# Patient Record
Sex: Female | Born: 1960 | Race: Black or African American | Hispanic: No | Marital: Single | Smoking: Never smoker
Health system: Southern US, Community
[De-identification: ages and names within clinical notes are randomized; demographics above are authoritative.]

## PROBLEM LIST (undated history)

## (undated) ENCOUNTER — Emergency Department (HOSPITAL_BASED_OUTPATIENT_CLINIC_OR_DEPARTMENT_OTHER): Admission: EM | Source: Home / Self Care

## (undated) DIAGNOSIS — I639 Cerebral infarction, unspecified: Secondary | ICD-10-CM

## (undated) DIAGNOSIS — R569 Unspecified convulsions: Secondary | ICD-10-CM

## (undated) DIAGNOSIS — I10 Essential (primary) hypertension: Secondary | ICD-10-CM

## (undated) DIAGNOSIS — Z87442 Personal history of urinary calculi: Secondary | ICD-10-CM

## (undated) HISTORY — DX: Unspecified convulsions: R56.9

## (undated) HISTORY — PX: APPENDECTOMY: SHX54

## (undated) HISTORY — DX: Personal history of urinary calculi: Z87.442

## (undated) HISTORY — DX: Cerebral infarction, unspecified: I63.9

---

## 2012-06-19 ENCOUNTER — Ambulatory Visit (INDEPENDENT_AMBULATORY_CARE_PROVIDER_SITE_OTHER): Payer: Medicare Other | Admitting: Family

## 2012-06-19 ENCOUNTER — Encounter: Payer: Self-pay | Admitting: Family

## 2012-06-19 VITALS — BP 140/82 | HR 100 | Temp 98.3°F | Resp 16 | Ht 61.0 in | Wt 174.0 lb

## 2012-06-19 DIAGNOSIS — Z8673 Personal history of transient ischemic attack (TIA), and cerebral infarction without residual deficits: Secondary | ICD-10-CM

## 2012-06-19 DIAGNOSIS — R21 Rash and other nonspecific skin eruption: Secondary | ICD-10-CM

## 2012-06-19 DIAGNOSIS — G40909 Epilepsy, unspecified, not intractable, without status epilepticus: Secondary | ICD-10-CM

## 2012-06-19 DIAGNOSIS — Z0000 Encounter for general adult medical examination without abnormal findings: Secondary | ICD-10-CM

## 2012-06-19 MED ORDER — FLUCONAZOLE 150 MG PO TABS: ORAL_TABLET | ORAL | Status: DC

## 2012-06-19 NOTE — Progress Notes (Signed)
Subjective:    Patient ID: Latasha [name], female    DOB: 05-05-1961, 51 y.o.   MRN: [id number]  HPI  [name] is a 51 yr old african [place] female who presents today to establish care.  She is accompanied today by her mother.  She has not had a recent PCP.    Skin discoloration-  Right side of cheek, not itching, started 3 weeks ago.  Seems to be worsening  Hx CVA- this occurred at age 51.  This was following a ruptured appendicitis.  She apparently suffered  Shock following this episode and developed severe hypotension.  Hx is obtained by her mother. 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  Took her 18 months before she could speak or recognize family members.  Mother reports that the patient is independent with basic ADL's but doesn't   Seizure disorder- >20 yrs since she has had a seizure.  She is not currently on any medication. Review of Systems See HPI  Past Medical History  Diagnosis Date  . History of kidney stones   . Stroke     51 years old.  . Seizures     from childhood    History   Social History  . Marital Status: Single    Spouse Name: N/A    Number of Children: 3  . Years of Education: N/A   Occupational History  . Not on file.   Social History Main Topics  . Smoking status: Never Smoker   . Smokeless tobacco: Never Used  . Alcohol Use: No  . Drug Use: No  . Sexually Active: Not on file   Other Topics Concern  . Not on file   Social History Narrative   Regular exercise:  NoCaffeine use: 1 soda daily3 childrenLives with mother    Past Surgical History  Procedure Date  . Appendectomy     51 years old    Family History  Problem Relation Age of Onset  . Arthritis Mother   . Hyperlipidemia Mother   . Hypertension Mother   . Diabetes Mother   . Stroke Mother   . Arthritis Father   . Hypertension Father   . Arthritis Sister   . Hyperlipidemia Sister     Allergies  Allergen Reactions  . Penicillins Rash     No current outpatient prescriptions on file prior to visit.    BP 140/82  Pulse 100  Temp 98.3 F (36.8 C) (Oral)  Resp 16  Ht 5\' 1"  (1.549 m)  Wt 174 lb (78.926 kg)  BMI 32.88 kg/m2  SpO2 99%  LMP 11/15/1957        Objective:   Physical Exam  Constitutional: She appears well-developed and well-nourished. No distress.  HENT:  Head: Normocephalic and atraumatic.  Eyes: No scleral icterus.  Cardiovascular: Normal rate and regular rhythm.   No murmur heard. Pulmonary/Chest: Effort normal and breath sounds normal. No respiratory distress. She has no wheezes. She has no rales.  Neurological: She is alert.       RUE/RLE 5/5 strength LLE/LUE 4-5/5 strength Speech is clear  Skin:       Hyperpigmentation noted of right cheek extending down toward chin and beneath the left eye.  Psychiatric: Her mood appears not anxious. She does not exhibit a depressed mood.       terse but appropriate responses to questioning- pt defers many questions to her mother.          Assessment &  Plan:  25 minutes spent with pt today.  >50% of this time was spent counseling pt on the importance of health care maintenance, rx/cause of facial rash.

## 2012-06-19 NOTE — Patient Instructions (Addendum)
Please complete your blood work prior to leaving. Follow up in 1 month for a complete physical and pap smear.  Come fasting to this appointment. You will be contact about your referral to dermatology. Please let us know if you have not heard back within 1 week about your referral. Welcome to [facility]!

## 2012-06-20 ENCOUNTER — Encounter: Payer: Self-pay | Admitting: Family

## 2012-06-20 LAB — URINALYSIS, ROUTINE W REFLEX MICROSCOPIC
Bilirubin Urine: NEGATIVE
Glucose, UA: NEGATIVE mg/dL
Ketones, ur: NEGATIVE mg/dL
Specific Gravity, Urine: 1.007 (ref 1.005–1.030)
pH: 7 (ref 5.0–8.0)

## 2012-06-20 LAB — HEPATIC FUNCTION PANEL
Alkaline Phosphatase: 77 U/L (ref 39–117)
Bilirubin, Direct: 0.1 mg/dL (ref 0.0–0.3)
Indirect Bilirubin: 0.3 mg/dL (ref 0.0–0.9)

## 2012-06-20 LAB — BASIC METABOLIC PANEL WITH GFR
BUN: 12 mg/dL (ref 6–23)
Chloride: 101 mEq/L (ref 96–112)
GFR, Est African American: >89 mL/min
GFR, Est Non African American: 86 mL/min
Glucose, Bld: 86 mg/dL (ref 70–99)
Potassium: 3.9 mEq/L (ref 3.5–5.3)
Sodium: 136 mEq/L (ref 135–145)

## 2012-06-25 ENCOUNTER — Encounter: Payer: Self-pay | Admitting: Family

## 2012-06-25 DIAGNOSIS — Z8673 Personal history of transient ischemic attack (TIA), and cerebral infarction without residual deficits: Secondary | ICD-10-CM | POA: Insufficient documentation

## 2012-06-25 DIAGNOSIS — R21 Rash and other nonspecific skin eruption: Secondary | ICD-10-CM | POA: Insufficient documentation

## 2012-06-25 DIAGNOSIS — G40909 Epilepsy, unspecified, not intractable, without status epilepticus: Secondary | ICD-10-CM | POA: Insufficient documentation

## 2012-06-25 NOTE — Assessment & Plan Note (Signed)
Stable.  Monitor.  

## 2012-06-25 NOTE — Assessment & Plan Note (Signed)
Rash is most consistent with tinea.  Rx with diflucan.

## 2012-06-25 NOTE — Assessment & Plan Note (Signed)
Plan to obtain FLP next visit and repeat BP. Goal bp will be <130/80.  Consiser adding agent next visit along with aspirin 81mg .

## 2012-07-25 ENCOUNTER — Encounter: Payer: Self-pay | Admitting: Family

## 2012-07-25 ENCOUNTER — Ambulatory Visit (INDEPENDENT_AMBULATORY_CARE_PROVIDER_SITE_OTHER): Payer: Medicare Other | Admitting: Family

## 2012-07-25 ENCOUNTER — Encounter: Payer: Medicare Other | Admitting: Family

## 2012-07-25 ENCOUNTER — Other Ambulatory Visit (HOSPITAL_COMMUNITY)
Admission: RE | Admit: 2012-07-25 | Discharge: 2012-07-25 | Disposition: A | Discharge status: Home / Self Care | Payer: Medicare Other | Source: Ambulatory Visit | Attending: Family | Admitting: Family

## 2012-07-25 VITALS — BP 138/94 | HR 70 | Temp 97.8°F | Resp 16 | Ht 61.0 in | Wt 173.1 lb

## 2012-07-25 DIAGNOSIS — Z01419 Encounter for gynecological examination (general) (routine) without abnormal findings: Secondary | ICD-10-CM

## 2012-07-25 DIAGNOSIS — G40909 Epilepsy, unspecified, not intractable, without status epilepticus: Secondary | ICD-10-CM

## 2012-07-25 DIAGNOSIS — Z13820 Encounter for screening for osteoporosis: Secondary | ICD-10-CM

## 2012-07-25 DIAGNOSIS — R030 Elevated blood-pressure reading, without diagnosis of hypertension: Secondary | ICD-10-CM

## 2012-07-25 DIAGNOSIS — R21 Rash and other nonspecific skin eruption: Secondary | ICD-10-CM

## 2012-07-25 DIAGNOSIS — Z1239 Encounter for other screening for malignant neoplasm of breast: Secondary | ICD-10-CM

## 2012-07-25 DIAGNOSIS — Z8673 Personal history of transient ischemic attack (TIA), and cerebral infarction without residual deficits: Secondary | ICD-10-CM

## 2012-07-25 DIAGNOSIS — I6350 Cerebral infarction due to unspecified occlusion or stenosis of unspecified cerebral artery: Secondary | ICD-10-CM

## 2012-07-25 DIAGNOSIS — L819 Disorder of pigmentation, unspecified: Secondary | ICD-10-CM

## 2012-07-25 DIAGNOSIS — I10 Essential (primary) hypertension: Secondary | ICD-10-CM | POA: Insufficient documentation

## 2012-07-25 DIAGNOSIS — Z124 Encounter for screening for malignant neoplasm of cervix: Secondary | ICD-10-CM

## 2012-07-25 DIAGNOSIS — Z1211 Encounter for screening for malignant neoplasm of colon: Secondary | ICD-10-CM

## 2012-07-25 DIAGNOSIS — Z0000 Encounter for general adult medical examination without abnormal findings: Secondary | ICD-10-CM

## 2012-07-25 LAB — LIPID PANEL
Cholesterol: 154 mg/dL (ref 0–200)
HDL: 41 mg/dL (ref >39)
LDL Cholesterol: 97 mg/dL (ref 0–99)
Total CHOL/HDL Ratio: 3.8 Ratio
Triglycerides: 79 mg/dL (ref <150)
VLDL: 16 mg/dL (ref 0–40)

## 2012-07-25 MED ORDER — HYDROCHLOROTHIAZIDE 25 MG PO TABS: 25.0000 mg | ORAL_TABLET | Freq: Every day | ORAL | Status: DC

## 2012-07-25 NOTE — Patient Instructions (Addendum)
You will be contacted about your referral for colonoscopy, dermatology. Schedule mammogram on the first floor. Schedule bone density at the front desk. Start baby aspirin for stroke and heart attack prevention and HCTZ for your blood pressure. Follow up in 2 weeks for blood pressure check.

## 2012-07-25 NOTE — Assessment & Plan Note (Signed)
Stable- no seizures x 20 yrs.  Monitor.

## 2012-07-25 NOTE — Progress Notes (Signed)
Subjective:    Patient ID: Latasha [name], female    DOB: 1961/06/15, 51 y.o.   MRN: [id number]  HPI  Subjective:   Patient here for Medicare annual wellness visit and management of other chronic and acute problems.  Skin discoloration-  Last visit she was treated with diflucan for skin discoloration of her cheek which was felt likely to be tinea.  Pt and mother note only slight improvement.  Seizure disorder- >20 yrs since she has had a seizure. She is not currently on any medication.  Hx CVA- this occurred as a child.  She is at her baseline.   Preventative- Pt here for medicare wellness exam. Thinks last tetanus was within last 5 yrs. Declines flu shot today. ?never had mammogram, unsure if had DEXA. Last pap smear ?10 yrs ago.  Risk factors:  At risk for CVA given past history.  Roster of Physicians Providing Medical Care to Patient:  Activities of Daily Living  In your present state of health, do you have any difficulty performing the following activities? Preparing food and eating?: needs assist.   Family won't let her use the stove.  Bathing yourself: No  Getting dressed: No  Using the toilet:No  Moving around from place to place: No  In the past year have you fallen or had a near fall?:No     Home Safety: Has smoke detector and wears seat belts. No firearms. No excess sun exposure.  Diet and Exercise  Current exercise habits:  Dietary issues discussed: healthy diet   Depression Screen  (Note: if answer to either of the following is "Yes", then a more complete depression screening is indicated)  Q1: Over the past two weeks, have you felt down, depressed or hopeless?no  Q2: Over the past two weeks, have you felt little interest or pleasure in doing things? no   The following portions of the patient's history were reviewed and updated as appropriate: allergies, current medications, past family history, past medical history, past social history, past surgical history  and problem list.   Past Medical History  Diagnosis Date  . History of kidney stones   . Stroke     51 years old.  . Seizures     from childhood    History   Social History  . Marital Status: Single    Spouse Name: N/A    Number of Children: 3  . Years of Education: N/A   Occupational History  . Not on file.   Social History Main Topics  . Smoking status: Never Smoker   . Smokeless tobacco: Never Used  . Alcohol Use: No  . Drug Use: No  . Sexually Active: Not on file   Other Topics Concern  . Not on file   Social History Narrative   Regular exercise:  NoCaffeine use: 1 soda daily3 childrenLives with mother    Past Surgical History  Procedure Date  . Appendectomy     51 years old    Family History  Problem Relation Age of Onset  . Arthritis Mother   . Hyperlipidemia Mother   . Hypertension Mother   . Diabetes Mother   . Stroke Mother   . Arthritis Father   . Hypertension Father   . Arthritis Sister   . Hyperlipidemia Sister     Allergies  Allergen Reactions  . Penicillins Rash    Current Outpatient Prescriptions on File Prior to Visit  Medication Sig Dispense Refill  . fluconazole (DIFLUCAN) 150 MG tablet One tablet  by mouth once weekly for 3 weeks  3 tablet  0    BP 138/94  Pulse 70  Temp 97.8 F (36.6 C) (Oral)  Resp 16  Ht 5\' 1"  (1.549 m)  Wt 173 lb 1.3 oz (78.509 kg)  BMI 32.70 kg/m2  SpO2 99%  LMP 11/15/1957    Objective:   Vision: see nursing Hearing: able to hear forced whisper at 6 feet Body mass index:see vitals Cognitive Impairment Assessment: cognition, memory and judgment appear normal.   Assessment:   Medicare wellness utd on preventive parameters  Plan:        Pap smear performed today  Screening mammography  Bone densitometry screening  dexa scan Nutrition counseling   Vaccines / LABS  Flu shot declined.  pap today, refer for mammogram, colo and baseline dexa Patient Instructions (the written plan) was given  to the patient.       Review of Systems  Constitutional: Negative for unexpected weight change.  HENT: Negative for hearing loss.   Eyes: Negative for visual disturbance.  Respiratory: Negative for cough.   Cardiovascular: Negative for chest pain.  Gastrointestinal: Negative for vomiting, diarrhea and constipation.  Genitourinary: Negative for dysuria and frequency.  Musculoskeletal: Negative for arthralgias.  Skin:       + hyperpigmentation face.  Neurological: Negative for headaches.  Hematological: Does not bruise/bleed easily.  Psychiatric/Behavioral:       Denies depression.       Objective:   Physical Exam  Physical Exam  Constitutional: She is oriented to person, place, and time. She appears well-developed and well-nourished. No distress.  HENT:  Head: Normocephalic and atraumatic.  Right Ear: Tympanic membrane and ear canal normal.  Left Ear: Tympanic membrane and ear canal normal.  Mouth/Throat: Oropharynx is clear and moist.  Eyes: Pupils are equal, round, and reactive to light. No scleral icterus.  Neck: Normal range of motion. No thyromegaly present.  Cardiovascular: Normal rate and regular rhythm.   No murmur heard. Pulmonary/Chest: Effort normal and breath sounds normal. No respiratory distress. He has no wheezes. She has no rales. She exhibits no tenderness.  Abdominal: Soft. Bowel sounds are normal. He exhibits no distension and no mass. There is no tenderness. There is no rebound and no guarding.  Musculoskeletal: She exhibits no edema.  Lymphadenopathy:    She has no cervical adenopathy.  Neurological: slight facial asymmetry is noted.  Very mild left upper extremity and left lower extremity weakness is noted. Skin: Skin is warm and dry. hyperpigmentation noted of the right cheek.  Psychiatric: She has a normal mood and affect. Her behavior is normal. Judgment and thought content normal.  Breasts: Examined lying Right: Without masses, retractions,  discharge or axillary adenopathy.  Left: Without masses, retractions, discharge or axillary adenopathy.  Inguinal/mons: Normal without inguinal adenopathy  External genitalia: Normal  BUS/Urethra/Skene's glands: Normal  Bladder: Normal  Vagina: Normal  Cervix: Normal  Uterus: normal in size, shape and contour. Midline and mobile  Adnexa/parametria:  Rt: Without masses or tenderness.  Lt: Without masses or tenderness.  Anus and perineum: Normal           Assessment & Plan:         Assessment & Plan:

## 2012-07-25 NOTE — Assessment & Plan Note (Signed)
Obtain lipid panel, add baby aspirin for stroke prevention.

## 2012-07-25 NOTE — Addendum Note (Signed)
Addended by: Mervin Kung A on: 07/25/2012 02:13 PM   Modules accepted: Orders

## 2012-07-25 NOTE — Assessment & Plan Note (Signed)
Unchanged.  Refer to dermatology.  

## 2012-07-25 NOTE — Assessment & Plan Note (Signed)
Due to hx of CVA I would like to keep BP <130/80.  Will add hctz, plan follow up in 2 weeks for BP check and follow up BMET.

## 2012-07-26 NOTE — Progress Notes (Signed)
  Subjective:    Patient ID: Latasha [name], female    DOB: Jul 29, 1961, 51 y.o.   MRN: [id number]  HPI    Review of Systems     Objective:   Physical Exam        Assessment & Plan:  Opened in erro.

## 2012-07-28 ENCOUNTER — Encounter: Payer: Self-pay | Admitting: Family

## 2012-08-08 ENCOUNTER — Encounter: Payer: Self-pay | Admitting: Family

## 2012-08-08 ENCOUNTER — Ambulatory Visit (INDEPENDENT_AMBULATORY_CARE_PROVIDER_SITE_OTHER): Payer: Medicare Other | Admitting: Family

## 2012-08-08 VITALS — BP 138/96 | HR 99 | Temp 98.7°F | Resp 16 | Ht 61.0 in | Wt 175.0 lb

## 2012-08-08 DIAGNOSIS — I10 Essential (primary) hypertension: Secondary | ICD-10-CM

## 2012-08-08 LAB — BASIC METABOLIC PANEL
BUN: 10 mg/dL (ref 6–23)
CO2: 26 mEq/L (ref 19–32)
Chloride: 105 mEq/L (ref 96–112)
Creat: 0.7 mg/dL (ref 0.50–1.10)
Glucose, Bld: 84 mg/dL (ref 70–99)

## 2012-08-08 MED ORDER — AMLODIPINE BESYLATE 5 MG PO TABS: 5.0000 mg | ORAL_TABLET | Freq: Every day | ORAL | Status: DC

## 2012-08-08 NOTE — Progress Notes (Signed)
  Subjective:    Patient ID: Latasha [name], female    DOB: 1960/12/18, 51 y.o.   MRN: [id number]  HPI  Latasha [name] is a 51 yr old female who presents today for follow up of her blood pressure.  Last visit hctz 25 mg was added to her regimen.  She denies any problems with her medication and reports that she has been taking as prescribed.  BP Readings from Last 3 Encounters:  08/08/12 138/96  07/25/12 138/94  06/19/12 140/82   Review of Systems See  HPI  Past Medical History  Diagnosis Date  . History of kidney stones   . Stroke     51 years old.  . Seizures     from childhood    History   Social History  . Marital Status: Single    Spouse Name: N/A    Number of Children: 3  . Years of Education: N/A   Occupational History  . Not on file.   Social History Main Topics  . Smoking status: Never Smoker   . Smokeless tobacco: Never Used  . Alcohol Use: No  . Drug Use: No  . Sexually Active: Not on file   Other Topics Concern  . Not on file   Social History Narrative   Regular exercise:  NoCaffeine use: 1 soda daily3 childrenLives with mother    Past Surgical History  Procedure Date  . Appendectomy     51 years old    Family History  Problem Relation Age of Onset  . Arthritis Mother   . Hyperlipidemia Mother   . Hypertension Mother   . Diabetes Mother   . Stroke Mother   . Arthritis Father   . Hypertension Father   . Arthritis Sister   . Hyperlipidemia Sister     Allergies  Allergen Reactions  . Penicillins Rash    Current Outpatient Prescriptions on File Prior to Visit  Medication Sig Dispense Refill  . aspirin EC 81 MG tablet Take 81 mg by mouth daily.      . hydrochlorothiazide (HYDRODIURIL) 25 MG tablet Take 1 tablet (25 mg total) by mouth daily.  30 tablet  0  . amLODipine (NORVASC) 5 MG tablet Take 1 tablet (5 mg total) by mouth daily.  30 tablet  1    BP 138/96  Pulse 99  Temp 98.7 F (37.1 C) (Oral)  Resp 16  Ht 5\' 1"  (1.549 m)   Wt 175 lb 0.6 oz (79.398 kg)  BMI 33.07 kg/m2  SpO2 99%  LMP 11/15/1957       Objective:   Physical Exam  Constitutional: She appears well-developed and well-nourished. No distress.  Cardiovascular: Normal rate and regular rhythm.   No murmur heard. Pulmonary/Chest: Effort normal and breath sounds normal. No respiratory distress. She has no wheezes. She has no rales. She exhibits no tenderness.  Musculoskeletal: She exhibits no edema.  Psychiatric: She has a normal mood and affect. Her behavior is normal. Judgment and thought content normal.          Assessment & Plan:

## 2012-08-08 NOTE — Patient Instructions (Addendum)
Please complete your blood work prior to leaving.  Please schedule a follow up appointment in 1 month.  

## 2012-08-09 ENCOUNTER — Encounter: Payer: Self-pay | Admitting: Family

## 2012-08-10 NOTE — Assessment & Plan Note (Signed)
Unchanged.  Continue hctz, add amlodipine.  Obtain BMET. Follow up in 1 month.

## 2012-09-07 ENCOUNTER — Other Ambulatory Visit: Payer: Self-pay | Admitting: *Deleted

## 2012-09-07 ENCOUNTER — Telehealth: Payer: Self-pay | Admitting: Family

## 2012-09-07 ENCOUNTER — Ambulatory Visit (INDEPENDENT_AMBULATORY_CARE_PROVIDER_SITE_OTHER)
Admission: RE | Admit: 2012-09-07 | Discharge: 2012-09-07 | Disposition: A | Discharge status: Home / Self Care | Payer: Medicare Other | Source: Ambulatory Visit

## 2012-09-07 DIAGNOSIS — Z13820 Encounter for screening for osteoporosis: Secondary | ICD-10-CM

## 2012-09-07 DIAGNOSIS — Z780 Asymptomatic menopausal state: Secondary | ICD-10-CM

## 2012-09-07 NOTE — Progress Notes (Signed)
Duplicate order placed per [facility] request; original order could not be viewed in Epic.?/SLS

## 2012-09-07 NOTE — Progress Notes (Signed)
ERROR-Order already in system; placed by Sandford Craze, FNP on 09.10.13 OV/SLS Order faxed to [facility] [per [facility]].

## 2012-09-08 ENCOUNTER — Encounter: Payer: Self-pay | Admitting: Family

## 2012-09-08 ENCOUNTER — Ambulatory Visit (INDEPENDENT_AMBULATORY_CARE_PROVIDER_SITE_OTHER): Payer: Medicare Other | Admitting: Family

## 2012-09-08 VITALS — BP 132/78 | HR 85 | Temp 98.7°F | Resp 12 | Ht 61.0 in | Wt 176.1 lb

## 2012-09-08 DIAGNOSIS — I10 Essential (primary) hypertension: Secondary | ICD-10-CM

## 2012-09-08 MED ORDER — AMLODIPINE BESYLATE 5 MG PO TABS: 5.0000 mg | ORAL_TABLET | Freq: Every day | ORAL | Status: DC

## 2012-09-08 NOTE — Assessment & Plan Note (Signed)
BP is stable on current meds.  Continue same.  

## 2012-09-08 NOTE — Progress Notes (Signed)
  Subjective:    Patient ID: Latasha [name], female    DOB: 1961-03-20, 51 y.o.   MRN: [id number]  HPI  Latasha [name] is a 51 yr old female who presents today for follow up of her HTN. Last visit amlodipine was added to her HCTZ.  Denies CP, swelling, or SOB.     Review of Systems See HPI    Objective:   Physical Exam  Constitutional: She appears well-developed and well-nourished. No distress.  Cardiovascular: Normal rate and regular rhythm.   No murmur heard. Pulmonary/Chest: Effort normal and breath sounds normal.  Skin: Skin is warm and dry.  Psychiatric: She has a normal mood and affect. Her behavior is normal. Judgment and thought content normal.          Assessment & Plan:  Reviewed bone density preliminary reading. 2 of 3 T scores normal. One borderline osteopenia. Recommended regular weight bearing exercise and addition of caltrate BID.

## 2012-09-08 NOTE — Patient Instructions (Addendum)
Please schedule a follow up appointment in 3 months.

## 2012-09-19 ENCOUNTER — Encounter: Payer: Self-pay | Admitting: Family

## 2012-12-08 ENCOUNTER — Encounter: Payer: Self-pay | Admitting: Family

## 2012-12-08 ENCOUNTER — Ambulatory Visit (INDEPENDENT_AMBULATORY_CARE_PROVIDER_SITE_OTHER): Payer: Medicare Other | Admitting: Family

## 2012-12-08 VITALS — BP 146/80 | HR 89 | Temp 98.5°F | Resp 16 | Ht 61.0 in | Wt 178.0 lb

## 2012-12-08 DIAGNOSIS — I10 Essential (primary) hypertension: Secondary | ICD-10-CM

## 2012-12-08 MED ORDER — LISINOPRIL 10 MG PO TABS: 10.0000 mg | ORAL_TABLET | Freq: Every day | ORAL | Status: DC

## 2012-12-08 MED ORDER — HYDROCHLOROTHIAZIDE 25 MG PO TABS: 25.0000 mg | ORAL_TABLET | Freq: Every day | ORAL | Status: DC

## 2012-12-08 NOTE — Assessment & Plan Note (Signed)
After further discussion, pt reports that she is only taking amlodipine and aspirin. She is not taking hctz.  I advised her to resume hctz, continue amlodipine and follow up in 1 week. Rx for lisinopril cancelled at pharmacy.

## 2012-12-08 NOTE — Patient Instructions (Addendum)
Please start HCTZ and continue amlodipine. Follow up in 1 week.

## 2012-12-08 NOTE — Progress Notes (Signed)
  Subjective:    Patient ID: Latasha [name], female    DOB: [birth year], 52 y.o.   MRN: [id number]  HPI  Latasha [name] is here today for follow up of her HTN.  Last visit amlodipine was added to her regimen.  She denies sob, swelling or chest pain. Reports + compliance with amlodipine and HCTZ.    Review of Systems See HPI  Past Medical History  Diagnosis Date  . History of kidney stones   . Stroke     52 years old.  . Seizures     from childhood    History   Social History  . Marital Status: Single    Spouse Name: N/A    Number of Children: 3  . Years of Education: N/A   Occupational History  . Not on file.   Social History Main Topics  . Smoking status: Never Smoker   . Smokeless tobacco: Never Used  . Alcohol Use: No  . Drug Use: No  . Sexually Active: Not on file   Other Topics Concern  . Not on file   Social History Narrative   Regular exercise:  NoCaffeine use: 1 soda daily3 childrenLives with mother    Past Surgical History  Procedure Date  . Appendectomy     52 years old    Family History  Problem Relation Age of Onset  . Arthritis Mother   . Hyperlipidemia Mother   . Hypertension Mother   . Diabetes Mother   . Stroke Mother   . Arthritis Father   . Hypertension Father   . Arthritis Sister   . Hyperlipidemia Sister     Allergies  Allergen Reactions  . Penicillins Rash    Current Outpatient Prescriptions on File Prior to Visit  Medication Sig Dispense Refill  . amLODipine (NORVASC) 5 MG tablet Take 1 tablet (5 mg total) by mouth daily.  30 tablet  3  . aspirin EC 81 MG tablet Take 81 mg by mouth daily.      . Calcium Carbonate-Vitamin D (CALTRATE 600+D) 600-400 MG-UNIT per tablet Take 1 tablet by mouth 2 (two) times daily.      . hydrochlorothiazide (HYDRODIURIL) 25 MG tablet Take 1 tablet (25 mg total) by mouth daily.  30 tablet  0    BP 146/80  Pulse 89  Temp 98.5 F (36.9 C) (Oral)  Resp 16  Ht 5\' 1"  (1.549 m)  Wt 178 lb (80.74  kg)  BMI 33.63 kg/m2  SpO2 98%  LMP 11/15/1957       Objective:   Physical Exam  Constitutional: She appears well-developed and well-nourished. No distress.  Cardiovascular: Normal rate and regular rhythm.   No murmur heard. Pulmonary/Chest: Effort normal and breath sounds normal. No respiratory distress. She has no wheezes. She has no rales. She exhibits no tenderness.  Neurological: She is alert.  Psychiatric: She has a normal mood and affect. Her behavior is normal. Judgment and thought content normal.          Assessment & Plan:

## 2012-12-11 ENCOUNTER — Ambulatory Visit: Payer: Medicare Other | Admitting: Family

## 2012-12-15 ENCOUNTER — Encounter: Payer: Self-pay | Admitting: Family

## 2012-12-15 ENCOUNTER — Ambulatory Visit (INDEPENDENT_AMBULATORY_CARE_PROVIDER_SITE_OTHER): Payer: Medicare Other | Admitting: Family

## 2012-12-15 VITALS — BP 128/82 | HR 99 | Temp 98.2°F | Resp 16 | Ht 61.0 in | Wt 176.0 lb

## 2012-12-15 DIAGNOSIS — I10 Essential (primary) hypertension: Secondary | ICD-10-CM

## 2012-12-15 LAB — BASIC METABOLIC PANEL
BUN: 12 mg/dL (ref 6–23)
Calcium: 9.6 mg/dL (ref 8.4–10.5)
Chloride: 104 mEq/L (ref 96–112)
Creat: 0.8 mg/dL (ref 0.50–1.10)

## 2012-12-15 NOTE — Assessment & Plan Note (Signed)
Improved.  Continue HCTZ and amlodipine.  Obtain BMET.

## 2012-12-15 NOTE — Patient Instructions (Addendum)
Please complete your blood work prior to leaving. Please schedule a follow up appointment in 3 months.  

## 2012-12-15 NOTE — Progress Notes (Signed)
  Subjective:    Patient ID: Latasha [name], female    DOB: [birth year], 52 y.o.   MRN: [id number]  HPI  [name] is a 52 yr old female who presents today for follow up of her blood pressure.  Last visit she was only taking amlodipine.  She was instructed to resume hctz. Review of Systems See HPI  Past Medical History  Diagnosis Date  . History of kidney stones   . Stroke     52 years old.  . Seizures     from childhood    History   Social History  . Marital Status: Single    Spouse Name: N/A    Number of Children: 3  . Years of Education: N/A   Occupational History  . Not on file.   Social History Main Topics  . Smoking status: Never Smoker   . Smokeless tobacco: Never Used  . Alcohol Use: No  . Drug Use: No  . Sexually Active: Not on file   Other Topics Concern  . Not on file   Social History Narrative   Regular exercise:  NoCaffeine use: 1 soda daily3 childrenLives with mother    Past Surgical History  Procedure Date  . Appendectomy     52 years old    Family History  Problem Relation Age of Onset  . Arthritis Mother   . Hyperlipidemia Mother   . Hypertension Mother   . Diabetes Mother   . Stroke Mother   . Arthritis Father   . Hypertension Father   . Arthritis Sister   . Hyperlipidemia Sister     Allergies  Allergen Reactions  . Penicillins Rash    Current Outpatient Prescriptions on File Prior to Visit  Medication Sig Dispense Refill  . amLODipine (NORVASC) 5 MG tablet Take 1 tablet (5 mg total) by mouth daily.  30 tablet  3  . aspirin EC 81 MG tablet Take 81 mg by mouth daily.      . Calcium Carbonate-Vitamin D (CALTRATE 600+D) 600-400 MG-UNIT per tablet Take 1 tablet by mouth 2 (two) times daily.      . hydrochlorothiazide (HYDRODIURIL) 25 MG tablet Take 1 tablet (25 mg total) by mouth daily.  30 tablet  2    BP 128/82  Pulse 99  Temp 98.2 F (36.8 C) (Oral)  Resp 16  Ht 5\' 1"  (1.549 m)  Wt 176 lb (79.833 kg)  BMI 33.25 kg/m2   SpO2 99%  LMP 11/15/1957       Objective:   Physical Exam  Constitutional: She is oriented to person, place, and time. She appears well-developed and well-nourished. No distress.  Cardiovascular: Normal rate and regular rhythm.   No murmur heard. Pulmonary/Chest: Effort normal and breath sounds normal. No respiratory distress. She has no wheezes. She has no rales. She exhibits no tenderness.  Musculoskeletal: She exhibits no edema.  Neurological: She is alert and oriented to person, place, and time.  Skin: Skin is warm and dry.  Psychiatric: She has a normal mood and affect. Her behavior is normal. Judgment and thought content normal.          Assessment & Plan:

## 2012-12-18 ENCOUNTER — Encounter: Payer: Self-pay | Admitting: Family

## 2013-03-12 ENCOUNTER — Ambulatory Visit (INDEPENDENT_AMBULATORY_CARE_PROVIDER_SITE_OTHER): Payer: Medicare Other | Admitting: Family

## 2013-03-12 ENCOUNTER — Encounter: Payer: Self-pay | Admitting: Family

## 2013-03-12 VITALS — BP 140/90 | HR 76 | Temp 98.4°F | Resp 16 | Ht 61.0 in | Wt 173.1 lb

## 2013-03-12 DIAGNOSIS — I10 Essential (primary) hypertension: Secondary | ICD-10-CM

## 2013-03-12 MED ORDER — AMLODIPINE BESYLATE 5 MG PO TABS: 5.0000 mg | ORAL_TABLET | Freq: Every day | ORAL | Status: DC

## 2013-03-12 MED ORDER — HYDROCHLOROTHIAZIDE 25 MG PO TABS: 25.0000 mg | ORAL_TABLET | Freq: Every day | ORAL | Status: DC

## 2013-03-12 NOTE — Progress Notes (Signed)
  Subjective:    Patient ID: Latasha [name], female    DOB: 01/06/1961, 51 y.o.   MRN: [id number]  HPI  [name] is a 52 yr old female who presents today for follow up of her HTN.  She reports + compliance with her medications. Denies CP, SOB or swelling.   Review of Systems See HPI  Past Medical History  Diagnosis Date  . History of kidney stones   . Stroke     52 years old.  . Seizures     from childhood    History   Social History  . Marital Status: Single    Spouse Name: N/A    Number of Children: 3  . Years of Education: N/A   Occupational History  . Not on file.   Social History Main Topics  . Smoking status: Never Smoker   . Smokeless tobacco: Never Used  . Alcohol Use: No  . Drug Use: No  . Sexually Active: Not on file   Other Topics Concern  . Not on file   Social History Narrative   Regular exercise:  No   Caffeine use: 1 soda daily   3 children   Lives with mother             Past Surgical History  Procedure Laterality Date  . Appendectomy      52 years old    Family History  Problem Relation Age of Onset  . Arthritis Mother   . Hyperlipidemia Mother   . Hypertension Mother   . Diabetes Mother   . Stroke Mother   . Arthritis Father   . Hypertension Father   . Arthritis Sister   . Hyperlipidemia Sister     Allergies  Allergen Reactions  . Penicillins Rash    Current Outpatient Prescriptions on File Prior to Visit  Medication Sig Dispense Refill  . aspirin EC 81 MG tablet Take 81 mg by mouth daily.      . Calcium Carbonate-Vitamin D (CALTRATE 600+D) 600-400 MG-UNIT per tablet Take 1 tablet by mouth 2 (two) times daily.       No current facility-administered medications on file prior to visit.    BP 140/90  Pulse 76  Temp(Src) 98.4 F (36.9 C) (Oral)  Resp 16  Ht 5\' 1"  (1.549 m)  Wt 173 lb 1.9 oz (78.527 kg)  BMI 32.73 kg/m2  SpO2 99%  LMP 11/15/1957       Objective:   Physical Exam  Constitutional: She  appears well-developed and well-nourished. No distress.  Cardiovascular: Normal rate and regular rhythm.   No murmur heard. Pulmonary/Chest: Effort normal and breath sounds normal. No respiratory distress. She has no wheezes. She has no rales. She exhibits no tenderness.  Musculoskeletal: She exhibits no edema.  Neurological: She is alert.  Skin: Skin is warm and dry.          Assessment & Plan:

## 2013-03-12 NOTE — Assessment & Plan Note (Signed)
BP stable.  Continue current meds.  Plan bmet next visit.

## 2013-03-12 NOTE — Patient Instructions (Addendum)
Please follow up in 3 months.  

## 2013-06-11 ENCOUNTER — Ambulatory Visit: Payer: Medicare Other | Admitting: Family

## 2013-06-18 ENCOUNTER — Ambulatory Visit (INDEPENDENT_AMBULATORY_CARE_PROVIDER_SITE_OTHER): Payer: Medicare Other | Admitting: Family

## 2013-06-18 ENCOUNTER — Encounter: Payer: Self-pay | Admitting: Family

## 2013-06-18 VITALS — BP 124/80 | HR 71 | Temp 98.0°F | Ht 61.0 in | Wt 177.2 lb

## 2013-06-18 DIAGNOSIS — I10 Essential (primary) hypertension: Secondary | ICD-10-CM

## 2013-06-18 LAB — BASIC METABOLIC PANEL
BUN: 11 mg/dL (ref 6–23)
CO2: 28 mEq/L (ref 19–32)
Chloride: 104 mEq/L (ref 96–112)
Creat: 0.92 mg/dL (ref 0.50–1.10)
Glucose, Bld: 87 mg/dL (ref 70–99)

## 2013-06-18 MED ORDER — HYDROCHLOROTHIAZIDE 25 MG PO TABS: 25.0000 mg | ORAL_TABLET | Freq: Every day | ORAL | Status: DC

## 2013-06-18 MED ORDER — AMLODIPINE BESYLATE 5 MG PO TABS: 5.0000 mg | ORAL_TABLET | Freq: Every day | ORAL | Status: DC

## 2013-06-18 NOTE — Progress Notes (Signed)
  Subjective:    Patient ID: Latasha [name], female    DOB: [birth year], 53 y.o.   MRN: [id number]  HPI  Latasha [name] is a 52 yr old female who presents today for follow up of her hypertension. She is currently maintained on hctz and amlodipine.  Reports feeling well. Denies CP/SOB or swelling.    Review of Systems See HPI  Past Medical History  Diagnosis Date  . History of kidney stones   . Stroke     52 years old.  . Seizures     from childhood    History   Social History  . Marital Status: Single    Spouse Name: N/A    Number of Children: 3  . Years of Education: N/A   Occupational History  . Not on file.   Social History Main Topics  . Smoking status: Never Smoker   . Smokeless tobacco: Never Used  . Alcohol Use: No  . Drug Use: No  . Sexually Active: Not on file   Other Topics Concern  . Not on file   Social History Narrative   Regular exercise:  No   Caffeine use: 1 soda daily   3 children   Lives with mother             Past Surgical History  Procedure Laterality Date  . Appendectomy      52 years old    Family History  Problem Relation Age of Onset  . Arthritis Mother   . Hyperlipidemia Mother   . Hypertension Mother   . Diabetes Mother   . Stroke Mother   . Arthritis Father   . Hypertension Father   . Arthritis Sister   . Hyperlipidemia Sister     Allergies  Allergen Reactions  . Penicillins Rash    Current Outpatient Prescriptions on File Prior to Visit  Medication Sig Dispense Refill  . amLODipine (NORVASC) 5 MG tablet Take 1 tablet (5 mg total) by mouth daily.  30 tablet  3  . aspirin EC 81 MG tablet Take 81 mg by mouth daily.      . Calcium Carbonate-Vitamin D (CALTRATE 600+D) 600-400 MG-UNIT per tablet Take 1 tablet by mouth 2 (two) times daily.      . hydrochlorothiazide (HYDRODIURIL) 25 MG tablet Take 1 tablet (25 mg total) by mouth daily.  30 tablet  3   No current facility-administered medications on file prior to  visit.    BP 124/80  Pulse 71  Temp(Src) 98 F (36.7 C) (Oral)  Ht 5\' 1"  (1.549 m)  Wt 177 lb 4 oz (80.4 kg)  BMI 33.51 kg/m2  SpO2 97%  LMP 11/15/1957       Objective:   Physical Exam Gen: awake, alert, NAD CV: s1/s2. RRR  Resp: BS CTA bilaterally without w/r/r Ext: no peripheral edema is noted.        Assessment & Plan:

## 2013-06-18 NOTE — Assessment & Plan Note (Signed)
BP Readings from Last 3 Encounters:  06/18/13 124/80  03/12/13 140/90  12/15/12 128/82   BP stable.  Continue current meds. Obtain bmet.

## 2013-06-18 NOTE — Patient Instructions (Addendum)
Please complete your lab work prior to leaving. Please schedule a follow up appointment in 6 months.   

## 2013-06-19 ENCOUNTER — Encounter: Payer: Self-pay | Admitting: Family

## 2013-12-17 ENCOUNTER — Ambulatory Visit: Payer: Medicare Other | Admitting: Family

## 2013-12-24 ENCOUNTER — Encounter: Payer: Self-pay | Admitting: Family

## 2013-12-24 ENCOUNTER — Ambulatory Visit (INDEPENDENT_AMBULATORY_CARE_PROVIDER_SITE_OTHER): Payer: Medicare Other | Admitting: Family

## 2013-12-24 VITALS — BP 124/88 | HR 98 | Temp 98.7°F | Resp 20 | Ht 61.0 in | Wt 182.0 lb

## 2013-12-24 DIAGNOSIS — I10 Essential (primary) hypertension: Secondary | ICD-10-CM

## 2013-12-24 LAB — BASIC METABOLIC PANEL
BUN: 8 mg/dL (ref 6–23)
CHLORIDE: 99 meq/L (ref 96–112)
CO2: 28 meq/L (ref 19–32)
Calcium: 9.5 mg/dL (ref 8.4–10.5)
Creat: 0.64 mg/dL (ref 0.50–1.10)
GLUCOSE: 97 mg/dL (ref 70–99)
POTASSIUM: 3.7 meq/L (ref 3.5–5.3)
SODIUM: 139 meq/L (ref 135–145)

## 2013-12-24 MED ORDER — AMLODIPINE BESYLATE 5 MG PO TABS: 5.0000 mg | ORAL_TABLET | Freq: Every day | ORAL | Status: DC

## 2013-12-24 MED ORDER — HYDROCHLOROTHIAZIDE 25 MG PO TABS: 25.0000 mg | ORAL_TABLET | Freq: Every day | ORAL | Status: DC

## 2013-12-24 NOTE — Assessment & Plan Note (Addendum)
BP Readings from Last 3 Encounters:  12/24/13 124/88  06/18/13 124/80  03/12/13 140/90   Patient stable on amlodipine and HCTZ. Continue same. Obtain BMET today.

## 2013-12-24 NOTE — Patient Instructions (Signed)
Please complete lab work prior to leaving. Schedule fasting physical at the front desk.  

## 2013-12-24 NOTE — Progress Notes (Signed)
Pre visit review using our clinic review tool, if applicable. No additional management support is needed unless otherwise documented below in the visit note. 

## 2013-12-24 NOTE — Progress Notes (Signed)
Subjective:    Patient ID: Latasha [name], female    DOB: [birth year], 53 y.o.   MRN: [id number]  Hypertension Pertinent negatives include no chest pain, headaches or shortness of breath.   [name] is a 53 year old female who presents today for follow up of hypertension. Patient currently taking amlodipine 5mg  and HCTZ 25mg  and denies chest pain, shortness of breath, headaches, dizziness.  BP Readings from Last 3 Encounters:  12/24/13 124/88  06/18/13 124/80  03/12/13 140/90     Review of Systems  Constitutional: Negative for fever and chills.  HENT: Negative for rhinorrhea.   Respiratory: Negative for cough, chest tightness and shortness of breath.   Cardiovascular: Negative for chest pain.  Neurological: Negative for dizziness and headaches.   Past Medical History  Diagnosis Date  . History of kidney stones   . Stroke     53 years old.  . Seizures     from childhood    History   Social History  . Marital Status: Single    Spouse Name: N/A    Number of Children: 3  . Years of Education: N/A   Occupational History  . Not on file.   Social History Main Topics  . Smoking status: Never Smoker   . Smokeless tobacco: Never Used  . Alcohol Use: No  . Drug Use: No  . Sexual Activity: Not on file   Other Topics Concern  . Not on file   Social History Narrative   Regular exercise:  No   Caffeine use: 1 soda daily   3 children   Lives with mother             Past Surgical History  Procedure Laterality Date  . Appendectomy      [age] years old    Family History  Problem Relation Age of Onset  . Arthritis Mother   . Hyperlipidemia Mother   . Hypertension Mother   . Diabetes Mother   . Stroke Mother   . Arthritis Father   . Hypertension Father   . Arthritis Sister   . Hyperlipidemia Sister     Allergies  Allergen Reactions  . Penicillins Rash    Current Outpatient Prescriptions on File Prior to Visit  Medication Sig Dispense Refill  .  amLODipine (NORVASC) 5 MG tablet Take 1 tablet (5 mg total) by mouth daily.  30 tablet  5  . aspirin EC 81 MG tablet Take 81 mg by mouth daily.      . Calcium Carbonate-Vitamin D (CALTRATE 600+D) 600-400 MG-UNIT per tablet Take 1 tablet by mouth 2 (two) times daily.      . hydrochlorothiazide (HYDRODIURIL) 25 MG tablet Take 1 tablet (25 mg total) by mouth daily.  30 tablet  5   No current facility-administered medications on file prior to visit.    BP 124/88  Pulse 98  Temp(Src) 98.7 F (37.1 C) (Oral)  Resp 20  Ht 5\' 1"  (1.549 m)  Wt 182 lb (82.555 kg)  BMI 34.41 kg/m2  SpO2 98%  LMP 11/15/1957       Objective:   Physical Exam  Constitutional: She is oriented to person, place, and time. She appears well-nourished.  HENT:  Head: Normocephalic.  Cardiovascular: Normal rate, regular rhythm and normal heart sounds.   Pulmonary/Chest: Effort normal and breath sounds normal. No respiratory distress.  Musculoskeletal: She exhibits no edema.  Neurological: She is alert and oriented to person, place, and time.  Skin: Skin is warm  and dry.  Psychiatric: She has a normal mood and affect.          Assessment & Plan:  I have personally seen and examined patient and agree with Vernona Rieger NP student's assessment and plan.

## 2013-12-24 NOTE — Progress Notes (Signed)
   Subjective:    Patient ID: Latasha [name], female    DOB: Feb 29, 1961, 53 y.o.   MRN: [id number]  HPI    Review of Systems     Objective:   Physical Exam        Assessment & Plan:

## 2013-12-25 ENCOUNTER — Telehealth: Payer: Self-pay | Admitting: Family

## 2013-12-25 ENCOUNTER — Encounter: Payer: Self-pay | Admitting: Family

## 2013-12-25 NOTE — Telephone Encounter (Signed)
Relevant patient education mailed to patient.  

## 2014-01-28 ENCOUNTER — Ambulatory Visit (INDEPENDENT_AMBULATORY_CARE_PROVIDER_SITE_OTHER): Payer: Medicare Other | Admitting: Family

## 2014-01-28 ENCOUNTER — Encounter: Payer: Self-pay | Admitting: Family

## 2014-01-28 VITALS — BP 126/88 | HR 99 | Temp 98.5°F | Resp 18 | Ht 61.0 in | Wt 181.1 lb

## 2014-01-28 DIAGNOSIS — Z1231 Encounter for screening mammogram for malignant neoplasm of breast: Secondary | ICD-10-CM

## 2014-01-28 DIAGNOSIS — Z1239 Encounter for other screening for malignant neoplasm of breast: Secondary | ICD-10-CM

## 2014-01-28 DIAGNOSIS — Z8673 Personal history of transient ischemic attack (TIA), and cerebral infarction without residual deficits: Secondary | ICD-10-CM

## 2014-01-28 DIAGNOSIS — Z1211 Encounter for screening for malignant neoplasm of colon: Secondary | ICD-10-CM

## 2014-01-28 DIAGNOSIS — I10 Essential (primary) hypertension: Secondary | ICD-10-CM

## 2014-01-28 DIAGNOSIS — Z0000 Encounter for general adult medical examination without abnormal findings: Secondary | ICD-10-CM

## 2014-01-28 NOTE — Patient Instructions (Addendum)
You will be contacted about your referral to GI for colonoscopy. Please schedule mammogram on the first floor.  Follow up in 6 months.

## 2014-01-28 NOTE — Progress Notes (Addendum)
Subjective:    Patient ID: Latasha [name], female    DOB: [birth year], 53 y.o.   MRN: [id number]  HPI  Ms. Sample is a 53 yr old female who presents today for cpx.  Immunizations:  Due for tetanus Diet: reports healthy diet Exercise: enjoys walking and biking Colonoscopy:  Due  Dexa: 10/13 Pap Smear: last Pap was 9/13.  Mammogram:  Due  Subjective:   Patient here for Medicare annual wellness visit and management of other chronic and acute problems.  Risk factors: at risk for CAD  Roster of Physicians Providing Medical Care to Patient:   Activities of Daily Living  In your present state of health, do you have any difficulty performing the following activities? Preparing food and eating?: sister cooks for her Bathing yourself: No  Getting dressed: No  Using the toilet:No  Moving around from place to place: No  In the past year have you fallen or had a near fall?:No    Home Safety: Has smoke detector and wears seat belts. No firearms. No excess sun exposure.  Diet and Exercise  Current exercise habits: regular exercise Dietary issues discussed: healthy diet   Depression Screen  (Note: if answer to either of the following is "Yes", then a more complete depression screening is indicated)  Q1: Over the past two weeks, have you felt down, depressed or hopeless?no  Q2: Over the past two weeks, have you felt little interest or pleasure in doing things? no   The following portions of the patient's history were reviewed and updated as appropriate: allergies, current medications, past family history, past medical history, past social history, past surgical history and problem list.    Objective:   Vision:not performed Hearing: grossly intact Body mass index: Cognitive Impairment Assessment: cognition impaired.PSYCH:  Assessment:   Medicare wellness -consider Tdap next visit as pt wished to defer to her mother on this decision.   Plan:   During the course of the visit  the patient was educated and counseled about appropriate screening and preventive services including:  Screening mammography  Colonoscopy  Vaccines / LABS  Patient Instructions (the written plan) was given to the patient.       Review of Systems  HENT: Negative for hearing loss and rhinorrhea.   Eyes: Negative for visual disturbance.  Respiratory: Negative for cough and shortness of breath.   Cardiovascular: Negative for chest pain.  Gastrointestinal: Negative for vomiting and constipation.  Genitourinary: Negative for dysuria.  Musculoskeletal: Negative for arthralgias.  Skin: Negative for rash.  Neurological: Negative for headaches.  Hematological: Negative for adenopathy.  Psychiatric/Behavioral:       Denies depression/anxiety       Objective:   Physical Exam  Physical Exam  Constitutional: She is oriented to person, place, and time. She appears well-developed and well-nourished. No distress.  HENT:  Head: Normocephalic and atraumatic.  Right Ear: Tympanic membrane and ear canal normal.  Left Ear: Tympanic membrane and ear canal normal.  Mouth/Throat: Oropharynx is clear and moist.  Eyes: Pupils are equal, round, and reactive to light. No scleral icterus.  Neck: Normal range of motion. No thyromegaly present.  Cardiovascular: Normal rate and regular rhythm.   No murmur heard. Pulmonary/Chest: Effort normal and breath sounds normal. No respiratory distress. He has no wheezes. She has no rales. She exhibits no tenderness.  Abdominal: Soft. Bowel sounds are normal. He exhibits no distension and no mass. There is no tenderness. There is no rebound and no guarding.  Musculoskeletal: She  exhibits no edema.  Lymphadenopathy:    She has no cervical adenopathy.  Neurological: She is alert and oriented to person, place, and time. She has normal reflexes. She exhibits normal muscle tone. Coordination normal. Cognition impaired Skin: Skin is warm and dry.  Psychiatric:  Pleasant affect Her behavior is normal. Judgment and thought content normal.  Breasts: Examined lying Right: Without masses, retractions, discharge or axillary adenopathy.  Left: Without masses, retractions, discharge or axillary adenopathy.  Pelvic: deferred          Assessment & Plan:         Assessment & Plan:

## 2014-01-28 NOTE — Progress Notes (Signed)
Pre visit review using our clinic review tool, if applicable. No additional management support is needed unless otherwise documented below in the visit note. 

## 2014-01-29 ENCOUNTER — Encounter: Payer: Self-pay | Admitting: Gastroenterology

## 2014-03-25 ENCOUNTER — Encounter: Payer: Medicare Other | Admitting: Gastroenterology

## 2016-11-24 ENCOUNTER — Telehealth: Payer: Self-pay | Admitting: *Deleted

## 2016-11-24 NOTE — Telephone Encounter (Signed)
Receved call from pt's mom stating pt has appt with PCP on Friday at 8am. States pt had a seizure while in the hospital visiting her mother and they told her to see her PCP for referral to neurologist. States pt has seen [place]. Advised her we will address at her upcoming appt in Friday.

## 2016-11-25 NOTE — Telephone Encounter (Signed)
Noted  

## 2016-11-26 ENCOUNTER — Ambulatory Visit: Payer: Medicare Other | Admitting: Family

## 2016-11-26 ENCOUNTER — Encounter: Payer: Self-pay | Admitting: Family

## 2016-11-26 ENCOUNTER — Ambulatory Visit (INDEPENDENT_AMBULATORY_CARE_PROVIDER_SITE_OTHER): Payer: PPO | Admitting: Family

## 2016-11-26 VITALS — BP 135/79 | HR 96 | Temp 98.2°F | Resp 16 | Ht 62.0 in | Wt 171.8 lb

## 2016-11-26 DIAGNOSIS — R569 Unspecified convulsions: Secondary | ICD-10-CM

## 2016-11-26 NOTE — Patient Instructions (Signed)
You will be contacted about your referral to Neurology and for EEG.

## 2016-11-26 NOTE — Progress Notes (Signed)
Pre visit review using our clinic review tool, if applicable. No additional management support is needed unless otherwise documented below in the visit note. 

## 2016-11-26 NOTE — Progress Notes (Signed)
Subjective:    Patient ID: Latasha [name], female    DOB: 1961/11/06, 56 y.o.   MRN: [id number]  HPI  [name] is a 56 yr old female who presents today following a witnessed seizure. She apparently fell from a standing position, had facial shaking followed by vomiting and LOC x 1-2 minutes.  Reports currently feeling well. No seizure activity since that time. ER record is reviewed.  Has previous hx of seizure but last seizure was >20 years ago.  [facility] in the past.  Has been on phenobarbital in the past.  Ultimately established with Dr. Loleta ChanceHill in [place]  He had her on tegretol.   Review of Systems See HPI  Past Medical History:  Diagnosis Date  . History of kidney stones   . Seizures (HCC)    from childhood  . Stroke [facility](HCC)    56 years old.     Social History   Social History  . Marital status: Single    Spouse name: N/A  . Number of children: 3  . Years of education: N/A   Occupational History  . Not on file.   Social History Main Topics  . Smoking status: Never Smoker  . Smokeless tobacco: Never Used  . Alcohol use No  . Drug use: No  . Sexual activity: Not on file   Other Topics Concern  . Not on file   Social History Narrative   Regular exercise:  No   Caffeine use: 1 soda daily   3 children   Lives with mother             Past Surgical History:  Procedure Laterality Date  . APPENDECTOMY     56 years old    Family History  Problem Relation Age of Onset  . Arthritis Mother   . Hyperlipidemia Mother   . Hypertension Mother   . Diabetes Mother   . Stroke Mother   . Arthritis Father   . Hypertension Father   . Arthritis Sister   . Hyperlipidemia Sister     Allergies  Allergen Reactions  . Penicillins Rash    Current Outpatient Prescriptions on File Prior to Visit  Medication Sig Dispense Refill  . amLODipine (NORVASC) 5 MG tablet Take 1 tablet (5 mg total) by mouth daily. 30 tablet 5  . aspirin EC 81 MG tablet  Take 81 mg by mouth daily.    . Calcium Carbonate-Vitamin D (CALTRATE 600+D) 600-400 MG-UNIT per tablet Take 1 tablet by mouth 2 (two) times daily.    . hydrochlorothiazide (HYDRODIURIL) 25 MG tablet Take 1 tablet (25 mg total) by mouth daily. 30 tablet 5   No current facility-administered medications on file prior to visit.     BP 135/79 (BP Location: Left Arm, Cuff Size: Large)   Pulse 96   Temp 98.2 F (36.8 C) (Oral)   Resp 16   Ht 5\' 2"  (1.575 m)   Wt 171 lb 12.8 oz (77.9 kg)   LMP 11/15/1957   SpO2 100% Comment: room air  BMI 31.42 kg/m       Objective:   Physical Exam  Constitutional: She is oriented to person, place, and time. She appears well-developed and well-nourished. No distress.  Eyes: EOM are normal. Pupils are equal, round, and reactive to light.  Neurological: She is alert and oriented to person, place, and time. She exhibits normal muscle tone. Coordination normal.  Psychiatric: She has a normal mood and affect. Her  behavior is normal. Thought content normal.          Assessment & Plan:  Seizure- will refer to neurology for further evaluation and will also order EEG.  She is advised to return to the ER if recurrent seizure.

## 2017-02-02 ENCOUNTER — Encounter: Payer: Self-pay | Admitting: Neurology

## 2017-02-02 ENCOUNTER — Ambulatory Visit (INDEPENDENT_AMBULATORY_CARE_PROVIDER_SITE_OTHER): Payer: PPO | Admitting: Neurology

## 2017-02-02 VITALS — BP 112/90 | HR 97 | Temp 98.2°F | Resp 16 | Ht 60.5 in | Wt 171.6 lb

## 2017-02-02 DIAGNOSIS — Z8673 Personal history of transient ischemic attack (TIA), and cerebral infarction without residual deficits: Secondary | ICD-10-CM

## 2017-02-02 DIAGNOSIS — R569 Unspecified convulsions: Secondary | ICD-10-CM | POA: Diagnosis not present

## 2017-02-02 NOTE — Patient Instructions (Signed)
1. Schedule MRI brain without contrast 2. Schedule 1-hour EEG 3. Follow-up in 3-4 months, call for any changes
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
## 2017-02-03 ENCOUNTER — Encounter: Payer: Self-pay | Admitting: Neurology

## 2017-02-03 DIAGNOSIS — R569 Unspecified convulsions: Secondary | ICD-10-CM | POA: Insufficient documentation

## 2017-02-10 ENCOUNTER — Telehealth: Payer: Self-pay | Admitting: Neurology

## 2017-02-10 DIAGNOSIS — R569 Unspecified convulsions: Secondary | ICD-10-CM

## 2017-02-10 DIAGNOSIS — I10 Essential (primary) hypertension: Secondary | ICD-10-CM

## 2017-02-10 NOTE — Addendum Note (Signed)
Addended by: Areta HaberMOREHEAD, Rhandi Despain B on: 02/10/2017 09:38 AM   Modules accepted: Orders

## 2017-02-10 NOTE — Telephone Encounter (Signed)
Clld pt's mom, Latasha DandyMary - advsd MRI Brain w/wo contrast was canceled due to current lab work for Creatinine and BUN is needed. Latasha [name] advsd that they will be out of town next week. I advsd I will put in a future order for the labs - they can pick up the lab requisition on Tuesday, April 10 th and have lab work done on the 2nd floor. Mom stated that would be fine. I also advsd I would schedule the MRI once lab work has been done. Mom stated she understood.

## 2017-02-10 NOTE — Telephone Encounter (Signed)
Eber JonesCarolyn from [facility] called. MRI has been cancelled due to Ins in review and she needs to have Kreatin blood work before her MRI due to Hypertension. She said her last labs were in 2015. Eber JonesCarolyn has been unable to reach the patient. She would like you to please call them at [facility] to let them know when to reschedule MRI. Thanks

## 2017-02-12 ENCOUNTER — Ambulatory Visit (HOSPITAL_BASED_OUTPATIENT_CLINIC_OR_DEPARTMENT_OTHER): Payer: PPO

## 2017-02-17 ENCOUNTER — Other Ambulatory Visit: Payer: PPO

## 2017-02-21 NOTE — Progress Notes (Deleted)
Subjective:   Latasha [name] is a 56 y.o. female who presents for Medicare Annual (Subsequent) preventive examination.  Review of Systems:  No ROS.  Medicare Wellness Visit.     Sleep patterns: {SX; SLEEP PATTERNS:18802::"feels rested on waking","does not get up to void","gets up *** times nightly to void","sleeps *** hours nightly"}.   Home Safety/Smoke Alarms:   Living environment; residence and Firearm Safety: {Rehab home environment / accessibility:30080::"no firearms","firearms stored safely"}. Seat Belt Safety/Bike Helmet: Wears seat belt.   Counseling:   Eye Exam-  Dental-  Female:   Pap-       Mammo-        Dexa scan-        CCS-     Objective:     Vitals: LMP 11/15/1958   There is no height or weight on file to calculate BMI.   Tobacco History  Smoking Status  . Never Smoker  Smokeless Tobacco  . Never Used     Counseling given: Not Answered   Past Medical History:  Diagnosis Date  . History of kidney stones   . Seizures (HCC)    from childhood  . Stroke [facility])    56 years old.   Past Surgical History:  Procedure Laterality Date  . APPENDECTOMY     56 years old   Family History  Problem Relation Age of Onset  . Arthritis Mother   . Hyperlipidemia Mother   . Hypertension Mother   . Diabetes Mother   . Stroke Mother   . Arthritis Father   . Hypertension Father   . Arthritis Sister   . Hyperlipidemia Sister    History  Sexual Activity  . Sexual activity: Not on file    Outpatient Encounter Prescriptions as of 02/22/2017  Medication Sig  . amLODipine (NORVASC) 5 MG tablet Take 1 tablet (5 mg total) by mouth daily.  Marland Kitchen aspirin EC 81 MG tablet Take 81 mg by mouth daily.  . Calcium Carbonate-Vitamin D (CALTRATE 600+D) 600-400 MG-UNIT per tablet Take 1 tablet by mouth 2 (two) times daily.  . hydrochlorothiazide (HYDRODIURIL) 25 MG tablet Take 1 tablet (25 mg total) by mouth daily.   No facility-administered encounter medications on file  as of 02/22/2017.     Activities of Daily Living No flowsheet data found.  Patient Care Team: Sandford Craze, NP as PCP - General (Internal Medicine)    Assessment:    Physical assessment deferred to PCP.  Exercise Activities and Dietary recommendations    Diet (meal preparation, eat out, water intake, caffeinated beverages, dairy products, fruits and vegetables): {Desc; diets:16563} Breakfast: Lunch:  Dinner:      Goals    None     Fall Risk Fall Risk  02/02/2017  Falls in the past year? No   Depression Screen PHQ 2/9 Scores 11/26/2016  PHQ - 2 Score 0  PHQ- 9 Score 2     Cognitive Function         There is no immunization history on file for this patient. Screening Tests Health Maintenance  Topic Date Due  . Hepatitis C Screening  07-31-1961  . HIV Screening  05/31/1975  . TETANUS/TDAP  05/31/1979  . MAMMOGRAM  05/30/2010  . COLONOSCOPY  05/30/2010  . PAP SMEAR  07/26/2015  . INFLUENZA VACCINE  07/16/2017 (Originally 06/15/2017)      Plan:    Follow-up w/ PCP as scheduled.   During the course of the visit the patient was educated and counseled about the following  appropriate screening and preventive services:   Vaccines to include Pneumococcal, Influenza, Td, HCV  Cardiovascular Disease  Colorectal cancer screening  Bone density screening  Diabetes screening  Glaucoma screening  Mammography/PAP  Nutrition counseling   Patient Instructions (the written plan) was given to the patient.   Starla Link, RN  02/21/2017

## 2017-02-21 NOTE — Progress Notes (Deleted)
Pre visit review using our clinic review tool, if applicable. No additional management support is needed unless otherwise documented below in the visit note. 

## 2017-02-22 ENCOUNTER — Ambulatory Visit: Payer: PPO | Admitting: *Deleted

## 2017-02-22 ENCOUNTER — Telehealth: Payer: Self-pay | Admitting: Family

## 2017-02-22 NOTE — Telephone Encounter (Signed)
Caller Name :RENEA Moro (SISTER) [phone]   Reason for call:  Sister requesting lab orders prior to follow up with Melissa, patient also stated patient is in need of EKG, please advise regarding orders and when patient should schedule follow up.

## 2017-02-22 NOTE — Telephone Encounter (Signed)
Spoke with pt's sister, Renea. She states that pt saw neurologist and had EEG and is supposed to complete lab work per Dr Karel Jarvis (neuro). She is wanting pt to have labs done at our facility. Spoke with Onalee Hua in the lab and he confirmed that he can see the future order and ok to schedule here. Lab appt made for 02/23/17 at 9:30am. Also requests to r/s wellness visit with Eber Jones that is scheduled for this afternoon. States she will be bringing pt and mornings work better for her. MWV r/s for Friday at 9:30am with RN, Eber Jones.

## 2017-02-23 ENCOUNTER — Other Ambulatory Visit (INDEPENDENT_AMBULATORY_CARE_PROVIDER_SITE_OTHER): Payer: PPO

## 2017-02-23 DIAGNOSIS — I10 Essential (primary) hypertension: Secondary | ICD-10-CM

## 2017-02-23 DIAGNOSIS — R569 Unspecified convulsions: Secondary | ICD-10-CM | POA: Diagnosis not present

## 2017-02-23 LAB — BUN/CREATININE RATIO
BUN / CREAT RATIO: 14.1 ratio (ref 6–22)
BUN: 10 mg/dL (ref 7–25)
CREATININE: 0.71 mg/dL (ref 0.50–1.05)
GFR, Est African American: >89 mL/min (ref >=60)
GFR, Est Non African American: >89 mL/min (ref >=60)

## 2017-02-25 ENCOUNTER — Ambulatory Visit (INDEPENDENT_AMBULATORY_CARE_PROVIDER_SITE_OTHER): Payer: PPO | Admitting: *Deleted

## 2017-02-25 ENCOUNTER — Encounter: Payer: Self-pay | Admitting: *Deleted

## 2017-02-25 VITALS — BP 138/86 | HR 75 | Ht 61.0 in | Wt 171.8 lb

## 2017-02-25 DIAGNOSIS — Z0000 Encounter for general adult medical examination without abnormal findings: Secondary | ICD-10-CM | POA: Diagnosis not present

## 2017-02-25 DIAGNOSIS — Z780 Asymptomatic menopausal state: Secondary | ICD-10-CM

## 2017-02-25 NOTE — Progress Notes (Signed)
Pre visit review using our clinic review tool, if applicable. No additional management support is needed unless otherwise documented below in the visit note. 

## 2017-02-25 NOTE — Progress Notes (Signed)
Noted and agree. 

## 2017-02-25 NOTE — Patient Instructions (Addendum)
Latasha [name] , Thank you for taking time to come for your Medicare Wellness Visit. I appreciate your ongoing commitment to your health goals. Please review the following plan we discussed and let me know if I can assist you in the future.   These are the goals we discussed: Goals    . Healthy Lifestyle          Continue to eat heart healthy diet (full of fruits, vegetables, whole grains, lean protein, water--limit salt, fat, and sugar intake) and increase physical activity as tolerated.       This is a list of the screening recommended for you and due dates:  Health Maintenance  Topic Date Due  .  Hepatitis C: One time screening is recommended by Center for Disease Control  (CDC) for  adults born from 1 through 1965.   07-11-1961  . HIV Screening  05/31/1975  . Tetanus Vaccine  05/31/1979  . Colon Cancer Screening  05/30/2010  . Pap Smear  07/26/2015  . Mammogram  05/27/2017*  . Flu Shot  07/16/2017*  *Topic was postponed. The date shown is not the original due date.   Colorectal Cancer Screening Colorectal cancer screening is a group of tests used to check for colorectal cancer. Colorectal refers to your colon and rectum. Your colon and rectum are located at the end of your large intestine and carry your bowel movements out of your body. Why is colorectal cancer screening done? It is common for abnormal growths (polyps) to form in the lining of your colon, especially as you get older. These polyps can be cancerous or become cancerous. If colorectal cancer is found at an early stage, it is treatable. Who should be screened for colorectal cancer? Screening is recommended for all adults at average risk starting at age 62. Tests may be recommended every 1 to 10 years. Your health care provider may recommend earlier or more frequent screening if you have:  A history of colorectal cancer or polyps.  A family member with a history of colorectal cancer or polyps.  Inflammatory bowel  disease, such as ulcerative colitis or Crohn disease.  A type of hereditary colon cancer syndrome.  Colorectal cancer symptoms. Types of screening tests There are several types of colorectal screening tests. They include:  Guaiac-based fecal occult blood testing.  Fecal immunochemical test (FIT).  Stool DNA test.  Barium enema.  Virtual colonoscopy.  Sigmoidoscopy. During this test, a sigmoidoscope is used to examine your rectum and lower colon. A sigmoidoscope is a flexible tube with a camera that is inserted through your anus into your rectum and lower colon.  Colonoscopy. During this test, a colonoscope is used to examine your entire colon. A colonoscope is a long, thin, flexible tube with a camera. This test examines your entire colon and rectum. This information is not intended to replace advice given to you by your health care provider. Make sure you discuss any questions you have with your health care provider. Document Released: 04/21/2010 Document Revised: 06/10/2016 Document Reviewed: 02/07/2014 Elsevier Interactive Patient Education  2017 Elsevier Inc.  Bone Densitometry Bone densitometry is an imaging test that uses a special X-ray to measure the amount of calcium and other minerals in your bones (bone density). This test is also known as a bone mineral density test or dual-energy X-ray absorptiometry (DXA). The test can measure bone density at your hip and your spine. It is similar to having a regular X-ray. You may have this test to:  Diagnose  a condition that causes weak or thin bones (osteoporosis).  Predict your risk of a broken bone (fracture).  Determine how well osteoporosis treatment is working. Tell a health care provider about:  Any allergies you have.  All medicines you are taking, including vitamins, herbs, eye drops, creams, and over-the-counter medicines.  Any problems you or family members have had with anesthetic medicines.  Any blood disorders  you have.  Any surgeries you have had.  Any medical conditions you have.  Possibility of pregnancy.  Any other medical test you had within the previous 14 days that used contrast material. What are the risks? Generally, this is a safe procedure. However, problems can occur and may include the following:  This test exposes you to a very small amount of radiation.  The risks of radiation exposure may be greater to unborn children. What happens before the procedure?  Do not take any calcium supplements for 24 hours before having the test. You can otherwise eat and drink what you usually do.  Take off all metal jewelry, eyeglasses, dental appliances, and any other metal objects. What happens during the procedure?  You may lie on an exam table. There will be an X-ray generator below you and an imaging device above you.  Other devices, such as boxes or braces, may be used to position your body properly for the scan.  You will need to lie still while the machine slowly scans your body.  The images will show up on a computer monitor. What happens after the procedure? You may need more testing at a later time. This information is not intended to replace advice given to you by your health care provider. Make sure you discuss any questions you have with your health care provider. Document Released: 11/23/2004 Document Revised: 04/08/2016 Document Reviewed: 04/11/2014 Elsevier Interactive Patient Education  2017 [employer].  Mammogram A mammogram is an X-ray of the breasts that is done to check for abnormal changes. This procedure can screen for and detect any changes that may suggest breast cancer. A mammogram can also identify other changes and variations in the breast, such as:  Inflammation of the breast tissue (mastitis).  An infected area that contains a collection of pus (abscess).  A fluid-filled sac (cyst).  Fibrocystic changes. This is when breast tissue becomes denser,  which can make the tissue feel rope-like or uneven under the skin.  Tumors that are not cancerous (benign). Tell a health care provider about:  Any allergies you have.  If you have breast implants.  If you have had previous breast disease, biopsy, or surgery.  If you are breastfeeding.  Any possibility that you could be pregnant, if this applies.  If you are younger than age 27.  If you have a family history of breast cancer. What are the risks? Generally, this is a safe procedure. However, problems may occur, including:  Exposure to radiation. Radiation levels are very low with this test.  The results being misinterpreted.  The need for further tests.  The inability of the mammogram to detect certain cancers. What happens before the procedure?  Schedule your test about 1-2 weeks after your menstrual period. This is usually when your breasts are the least tender.  If you have had a mammogram done at a different facility in the past, get the mammogram X-rays or have them sent to your current exam facility in order to compare them.  Wash your breasts and under your arms the day of the test.  Do not wear deodorants, perfumes, lotions, or powders anywhere on your body on the day of the test.  Remove any jewelry from your neck.  Wear clothes that you can change into and out of easily. What happens during the procedure?  You will undress from the waist up and put on a gown.  You will stand in front of the X-ray machine.  Each breast will be placed between two plastic or glass plates. The plates will compress your breast for a few seconds. Try to stay as relaxed as possible during the procedure. This does not cause any harm to your breasts and any discomfort you feel will be very brief.  X-rays will be taken from different angles of each breast. The procedure may vary among health care providers and hospitals. What happens after the procedure?  The mammogram will be  examined by a specialist (radiologist).  You may need to repeat certain parts of the test, depending on the quality of the images. This is commonly done if the radiologist needs a better view of the breast tissue.  Ask when your test results will be ready. Make sure you get your test results.  You may resume your normal activities. This information is not intended to replace advice given to you by your health care provider. Make sure you discuss any questions you have with your health care provider. Document Released: 10/29/2000 Document Revised: 04/05/2016 Document Reviewed: 01/10/2015 Elsevier Interactive Patient Education  2017 [employer].

## 2017-02-25 NOTE — Progress Notes (Signed)
Subjective:   Latasha [name] is a 56 y.o. female who presents for Medicare Annual (Subsequent) preventive examination. She is accompanied by her mother today.   Review of Systems:  No ROS.  Medicare Wellness Visit.  Cardiac Risk Factors include: hypertension;obesity (BMI >30kg/m2)  Sleep patterns: no sleep issues and does not get up to void.   Home Safety/Smoke Alarms: Feels safe in home. Smoke alarms in place.  Living environment; residence and Firearm Safety: Lives w/ mother, sister, and daughter. 1-story house/ trailer, firearms stored safely. Seat Belt Safety/Bike Helmet: Wears seat belt.   Counseling:   Eye Exam- does not follow w/ eye doctor regularly. No vision issues.  Female:   Pap- Last 07/25/12. NEGATIVE FOR INTRAEPITHELIAL LESIONS OR MALIGNANCY.        Mammo- Not on file.       Dexa scan- Last 2013. Normal.       CCS- Not on file.    Objective:     Vitals: BP 138/86   Pulse 75   Ht  (1.549 m)   Wt 171 lb 12.8 oz (77.9 kg)   LMP 11/15/1957   SpO2 97%   BMI 32.46 kg/m   Body mass index is 32.46 kg/m.   Tobacco History  Smoking Status  . Never Smoker  Smokeless Tobacco  . Never Used     Counseling given: Not Answered   Past Medical History:  Diagnosis Date  . History of kidney stones   . Seizures (HCC)    from childhood  . Stroke [facility])    56 years old.   Past Surgical History:  Procedure Laterality Date  . APPENDECTOMY     55 years old   Family History  Problem Relation Age of Onset  . Arthritis Mother   . Hyperlipidemia Mother   . Hypertension Mother   . Diabetes Mother   . Stroke Mother   . Arthritis Father   . Hypertension Father   . Arthritis Sister   . Hyperlipidemia Sister    History  Sexual Activity  . Sexual activity: Yes    Outpatient Encounter Prescriptions as of 02/25/2017  Medication Sig  . amLODipine (NORVASC) 5 MG tablet Take 1 tablet (5 mg total) by mouth daily.  Marland Kitchen aspirin EC 81 MG tablet Take 81 mg by mouth  daily.  . Calcium Carbonate-Vitamin D (CALTRATE 600+D) 600-400 MG-UNIT per tablet Take 1 tablet by mouth 2 (two) times daily.  . [DISCONTINUED] hydrochlorothiazide (HYDRODIURIL) 25 MG tablet Take 1 tablet (25 mg total) by mouth daily.   No facility-administered encounter medications on file as of 02/25/2017.     Activities of Daily Living In your present state of health, do you have any difficulty performing the following activities: 02/25/2017  Hearing? N  Vision? N  Difficulty concentrating or making decisions? N  Walking or climbing stairs? N  Dressing or bathing? N  Doing errands, shopping? Y  Preparing Food and eating ? N  Using the Toilet? N  In the past six months, have you accidently leaked urine? N  Do you have problems with loss of bowel control? N  Managing your Medications? Y  Managing your Finances? N  Housekeeping or managing your Housekeeping? N  Some recent data might be hidden    Patient Care Team: Sandford Craze, NP as PCP - General (Internal Medicine) Van Clines, MD as Consulting Physician (Neurology)    Assessment:    Physical assessment deferred to PCP.  Exercise Activities and Dietary recommendations  Current Exercise Habits: Home exercise routine, Type of exercise: walking, Frequency (Times/Week): 7, Intensity: Mild  Diet (meal preparation, eat out, water intake, caffeinated beverages, dairy products, fruits and vegetables): on average, 3 meals per day. Regular diet w/ traditional Southern foods. Describes diet as in between healthy and unhealthy. Pt's mother and sister do most of the meal prep/cooking. Does not eat out frequently. Drinks plenty of water.  24 Hour Recall Breakfast: butter biscuit Lunch: sandwich Dinner: cabbage and cornbread      Goals    . Healthy Lifestyle          Continue to eat heart healthy diet (full of fruits, vegetables, whole grains, lean protein, water--limit salt, fat, and sugar intake) and increase physical  activity as tolerated.      Fall Risk Fall Risk  02/25/2017 02/02/2017  Falls in the past year? No No   Depression Screen PHQ 2/9 Scores 02/25/2017 11/26/2016  PHQ - 2 Score 0 0  PHQ- 9 Score - 2     Cognitive Function MMSE - Mini Mental State Exam 02/25/2017  Orientation to time 5  Orientation to Place 5  Registration 3  Attention/ Calculation 4  Recall 3  Language- name 2 objects 2  Language- repeat 1  Language- follow 3 step command 3  Language- read & follow direction 1  Write a sentence 0  Copy design 0  Total score 27         There is no immunization history on file for this patient.   Screening Tests Health Maintenance  Topic Date Due  . Hepatitis C Screening  06/27/1961  . HIV Screening  05/31/1975  . TETANUS/TDAP  05/31/1979  . COLONOSCOPY  05/30/2010  . PAP SMEAR  07/26/2015  . MAMMOGRAM  05/27/2017 (Originally 05/30/2010)  . INFLUENZA VACCINE  07/16/2017 (Originally 06/15/2017)      Plan:    Follow-up w/ PCP as scheduled.  Orders placed for DEXA.  During the course of the visit the patient was educated and counseled about the following appropriate screening and preventive services:   Vaccines to include Pneumococcal, Influenza, Td, HCV  Cardiovascular Disease  Colorectal cancer screening- Mother declines at this time. She will think about it and let PCP know at next visit.  Bone density screening  Diabetes screening  Mammography/PAP-Mother declines MMG at this time. She would like to think about it before proceeding.  Nutrition counseling   Patient Instructions (the written plan) was given to the patient.   Starla Link, RN  02/25/2017

## 2017-03-02 ENCOUNTER — Ambulatory Visit (INDEPENDENT_AMBULATORY_CARE_PROVIDER_SITE_OTHER): Payer: PPO | Admitting: Neurology

## 2017-03-02 DIAGNOSIS — R569 Unspecified convulsions: Secondary | ICD-10-CM

## 2017-03-21 NOTE — Procedures (Signed)
ELECTROENCEPHALOGRAM REPORT  Date of Study: 03/02/2017  Patient's Name: Latasha [name] MRN: [id number] Date of Birth: Jun 26, 1961  Referring Provider: Dr. Patrcia DollyKaren Karas Pickerill  Clinical History: This is a 56 year old woman with a history of childhood stroke, seizures, seizure-free since the 1990s until an episode of facial twitching and fall.  Medications: Norvasc, aspirin, Caltrate, HCTZ  Technical Summary: A multichannel digital 1-hour EEG recording measured by the international 10-20 system with electrodes applied with paste and impedances below 5000 ohms performed in our laboratory with EKG monitoring in an awake and asleep patient.  Hyperventilation and photic stimulation were performed.  The digital EEG was referentially recorded, reformatted, and digitally filtered in a variety of bipolar and referential montages for optimal display.    Description: The patient is awake and asleep during the recording.  During maximal wakefulness, there is a medium voltage 9 Hz posterior dominant rhythm, better formed over the right occipital region but seen occasionally on the left, that attenuates with eye opening. During drowsiness and sleep, there is an increase in theta slowing of the background.  Vertex waves and symmetric sleep spindles were seen.  Hyperventilation and photic stimulation did not elicit any abnormalities.  There were no epileptiform discharges or electrographic seizures seen.    EKG lead was unremarkable.  Impression: This 1-hour awake and asleep EEG is within normal limits, there is mild asymmetry over the occipital regions as noted above.  Clinical Correlation: A normal EEG does not exclude a clinical diagnosis of epilepsy.  If further clinical questions remain, prolonged EEG may be helpful.  Clinical correlation is advised.   Latasha [name], M.D.

## 2017-03-22 ENCOUNTER — Telehealth: Payer: Self-pay

## 2017-03-22 NOTE — Telephone Encounter (Signed)
Called pt.  No answer/no answering machine

## 2017-03-22 NOTE — Telephone Encounter (Signed)
-----   Message from Van ClinesKaren M Aquino, MD sent at 03/21/2017  4:42 PM EDT ----- Pls let her know I apologize for delay in EEG results, the machine had to be taken out for fixing. It is back and I have reviewed EEG, it is normal, no seizure discharges seen. Thanks

## 2017-03-23 NOTE — Telephone Encounter (Signed)
Called pt, spoke with mother.  Relayed resent EEG results.  I believe she is hard of hearing, I had to repeat results a few times.  She did not seem to understand at first, but eventually did.

## 2017-04-25 ENCOUNTER — Ambulatory Visit: Payer: PPO | Admitting: Family

## 2017-08-01 ENCOUNTER — Telehealth: Payer: Self-pay | Admitting: Family

## 2017-08-01 NOTE — Telephone Encounter (Signed)
Called pt to r/s appt for 02/27/2018 at 9:30 am to 9:00 am on Angel's schedule for the same day. Pt did not answer the phone. Will try to call patient at a later time.

## 2017-08-30 ENCOUNTER — Telehealth: Payer: Self-pay | Admitting: Family

## 2017-08-30 NOTE — Telephone Encounter (Signed)
Spoke with patient  regarding AWV scheduled in April 2019. Pt stated that she would only like to be scheduled for afternoon appointments. Original AWV appt was scheduled for 02/27/18 at 9:30am. Appt has been r/s for 02/27/2018 at 2pm. [facility]

## 2018-02-23 NOTE — Progress Notes (Deleted)
Subjective:   Latasha [name] is a 57 y.o. female who presents for Medicare Annual (Subsequent) preventive examination.  Review of Systems:  No ROS.  Medicare Wellness Visit. Additional risk factors are reflected in the social history.    Sleep patterns:  Home Safety/Smoke Alarms: Feels safe in home. Smoke alarms in place.  Living environment; residence and Firearm Safety:  Seat Belt Safety/Bike Helmet: Wears seat belt.   Female:   Pap-       Mammo-active order       Dexa scan-   Active order     CCS-     Objective:     Vitals: LMP 11/15/1957   There is no height or weight on file to calculate BMI.  Advanced Directives 02/25/2017  Does Patient Have a Medical Advance Directive? Yes  Type of Advance Directive Healthcare Power of Attorney  Copy of Healthcare Power of Attorney in Chart? No - copy requested    Tobacco Social History   Tobacco Use  Smoking Status Never Smoker  Smokeless Tobacco Never Used     Counseling given: Not Answered   Clinical Intake:                       Past Medical History:  Diagnosis Date  . History of kidney stones   . Seizures (HCC)    from childhood  . Stroke [facility]    57 years old.   Past Surgical History:  Procedure Laterality Date  . APPENDECTOMY     57 years old   Family History  Problem Relation Age of Onset  . Arthritis Mother   . Hyperlipidemia Mother   . Hypertension Mother   . Diabetes Mother   . Stroke Mother   . Arthritis Father   . Hypertension Father   . Arthritis Sister   . Hyperlipidemia Sister    Social History   Socioeconomic History  . Marital status: Single    Spouse name: Not on file  . Number of children: 3  . Years of education: Not on file  . Highest education level: Not on file  Occupational History  . Not on file  Social Needs  . Financial resource strain: Not on file  . Food insecurity:    Worry: Not on file    Inability: Not on file  . Transportation needs:   Medical: Not on file    Non-medical: Not on file  Tobacco Use  . Smoking status: Never Smoker  . Smokeless tobacco: Never Used  Substance and Sexual Activity  . Alcohol use: No  . Drug use: No  . Sexual activity: Yes  Lifestyle  . Physical activity:    Days per week: Not on file    Minutes per session: Not on file  . Stress: Not on file  Relationships  . Social connections:    Talks on phone: Not on file    Gets together: Not on file    Attends religious service: Not on file    Active member of club or organization: Not on file    Attends meetings of clubs or organizations: Not on file    Relationship status: Not on file  Other Topics Concern  . Not on file  Social History Narrative   Regular exercise:  No   Caffeine use: 1 soda daily   3 children   Lives with mother             Outpatient Encounter Medications as of  02/27/2018  Medication Sig  . amLODipine (NORVASC) 5 MG tablet Take 1 tablet (5 mg total) by mouth daily.  Marland Kitchen. aspirin EC 81 MG tablet Take 81 mg by mouth daily.  . Calcium Carbonate-Vitamin D (CALTRATE 600+D) 600-400 MG-UNIT per tablet Take 1 tablet by mouth 2 (two) times daily.   No facility-administered encounter medications on file as of 02/27/2018.     Activities of Daily Living In your present state of health, do you have any difficulty performing the following activities: 02/25/2017  Hearing? N  Vision? N  Difficulty concentrating or making decisions? N  Walking or climbing stairs? N  Dressing or bathing? N  Doing errands, shopping? Y  Comment Patient does not drive.  Preparing Food and eating ? N  Using the Toilet? N  In the past six months, have you accidently leaked urine? N  Do you have problems with loss of bowel control? N  Managing your Medications? Y  Comment Mother prepares medications.  Managing your Finances? N  Housekeeping or managing your Housekeeping? N  Some recent data might be hidden    Patient Care Team: Sandford Craze'Sullivan,  Melissa, NP as PCP - General (Internal Medicine) Van ClinesAquino, Karen M, MD as Consulting Physician (Neurology)    Assessment:   This is a routine wellness examination for Latasha [name]. Physical assessment deferred to PCP.   Exercise Activities and Dietary recommendations   Diet (meal preparation, eat out, water intake, caffeinated beverages, dairy products, fruits and vegetables): {Desc; diets:16563} Breakfast: Lunch:  Dinner:      Goals    None      Fall Risk Fall Risk  02/25/2017 02/02/2017  Falls in the past year? No No    Depression Screen PHQ 2/9 Scores 02/25/2017 11/26/2016  PHQ - 2 Score 0 0  PHQ- 9 Score - 2     Cognitive Function MMSE - Mini Mental State Exam 02/25/2017  Orientation to time 5  Orientation to Place 5  Registration 3  Attention/ Calculation 4  Recall 3  Language- name 2 objects 2  Language- repeat 1  Language- follow 3 step command 3  Language- read & follow direction 1  Write a sentence 0  Copy design 0  Total score 27         There is no immunization history on file for this patient.  Qualifies for Shingles Vaccine?***  Screening Tests Health Maintenance  Topic Date Due  . Hepatitis C Screening  12/29/1960  . HIV Screening  05/31/1975  . TETANUS/TDAP  05/31/1979  . MAMMOGRAM  05/30/2010  . COLONOSCOPY  05/30/2010  . PAP SMEAR  07/26/2015  . INFLUENZA VACCINE  06/15/2018       Plan:   ***   I have personally reviewed and noted the following in the patient's chart:   . Medical and social history . Use of alcohol, tobacco or illicit drugs  . Current medications and supplements . Functional ability and status . Nutritional status . Physical activity . Advanced directives . List of other physicians . Hospitalizations, surgeries, and ER visits in previous 12 months . Vitals . Screenings to include cognitive, depression, and falls . Referrals and appointments  In addition, I have reviewed and discussed with patient certain  preventive protocols, quality metrics, and best practice recommendations. A written personalized care plan for preventive services as well as general preventive health recommendations were provided to patient.     Avon GullyBritt, Talita Recht Angel, [place]  02/23/2018

## 2018-02-27 ENCOUNTER — Ambulatory Visit: Payer: PPO | Admitting: *Deleted

## 2018-07-21 NOTE — Progress Notes (Signed)
Subjective:   Latasha [name] is a 57 y.o. female who presents for Medicare Annual (Subsequent) preventive examination.  Review of Systems: No ROS.  Medicare Wellness Visit. Additional risk factors are reflected in the social history. Cardiac Risk Factors include: advanced age (>58men, >34 women);hypertension Sleep patterns:  No issues. Home Safety/Smoke Alarms: Feels safe in home. Smoke alarms in place. Lives with mother,sister, and daughter.   Female:   Pap-  declines Mammo- declines      Dexa scan- declines     CCS- declines.    Objective:     Vitals: BP (!) 142/90 (BP Location: Left Arm, Patient Position: Sitting, Cuff Size: Normal)   Pulse 70   Ht 5\' 1"  (1.549 m)   Wt 173 lb 12.8 oz (78.8 kg)   LMP 11/15/1957   SpO2 97%   BMI 32.84 kg/m   Body mass index is 32.84 kg/m.  Advanced Directives 07/24/2018 02/25/2017  Does Patient Have a Medical Advance Directive? No Yes  Type of Advance Directive - Healthcare Power of Attorney  Does patient want to make changes to medical advance directive? Yes (MAU/Ambulatory/Procedural Areas - Information given) -  Copy of Healthcare Power of Attorney in Chart? - No - copy requested    Tobacco Social History   Tobacco Use  Smoking Status Never Smoker  Smokeless Tobacco Never Used     Counseling given: Not Answered   Clinical Intake: Pain : No/denies pain    Past Medical History:  Diagnosis Date  . History of kidney stones   . Seizures (HCC)    from childhood  . Stroke [facility])    57 years old.   Past Surgical History:  Procedure Laterality Date  . APPENDECTOMY     57 years old   Family History  Problem Relation Age of Onset  . Arthritis Mother   . Hyperlipidemia Mother   . Hypertension Mother   . Diabetes Mother   . Stroke Mother   . Arthritis Father   . Hypertension Father   . Arthritis Sister   . Hyperlipidemia Sister    Social History   Socioeconomic History  . Marital status: Single    Spouse name: Not  on file  . Number of children: 3  . Years of education: Not on file  . Highest education level: Not on file  Occupational History  . Not on file  Social Needs  . Financial resource strain: Not on file  . Food insecurity:    Worry: Not on file    Inability: Not on file  . Transportation needs:    Medical: Not on file    Non-medical: Not on file  Tobacco Use  . Smoking status: Never Smoker  . Smokeless tobacco: Never Used  Substance and Sexual Activity  . Alcohol use: No  . Drug use: No  . Sexual activity: Yes  Lifestyle  . Physical activity:    Days per week: Not on file    Minutes per session: Not on file  . Stress: Not on file  Relationships  . Social connections:    Talks on phone: Not on file    Gets together: Not on file    Attends religious service: Not on file    Active member of club or organization: Not on file    Attends meetings of clubs or organizations: Not on file    Relationship status: Not on file  Other Topics Concern  . Not on file  Social History Narrative  Regular exercise:  No   Caffeine use: 1 soda daily   3 children   Lives with mother             Outpatient Encounter Medications as of 07/24/2018  Medication Sig  . amLODipine (NORVASC) 5 MG tablet Take 1 tablet (5 mg total) by mouth daily.  Marland Kitchen aspirin EC 81 MG tablet Take 81 mg by mouth daily.  . Calcium Carbonate-Vitamin D (CALTRATE 600+D) 600-400 MG-UNIT per tablet Take 1 tablet by mouth 2 (two) times daily.   No facility-administered encounter medications on file as of 07/24/2018.     Activities of Daily Living In your present state of health, do you have any difficulty performing the following activities: 07/24/2018  Hearing? N  Vision? N  Difficulty concentrating or making decisions? N  Walking or climbing stairs? N  Dressing or bathing? N  Doing errands, shopping? Y  Comment pt states she has never driven.  Preparing Food and eating ? Y  Using the Toilet? N  In the past six  months, have you accidently leaked urine? N  Do you have problems with loss of bowel control? N  Managing your Medications? Y  Managing your Finances? Y  Housekeeping or managing your Housekeeping? N  Some recent data might be hidden    Patient Care Team: Sandford Craze, NP as PCP - General (Internal Medicine) Van Clines, MD as Consulting Physician (Neurology)    Assessment:   This is a routine wellness examination for Latasha [name]. Physical assessment deferred to PCP.  Exercise Activities and Dietary recommendations Current Exercise Habits: The patient does not participate in regular exercise at present, Exercise limited by: None identified Diet (meal preparation, eat out, water intake, caffeinated beverages, dairy products, fruits and vegetables): well balanced   Goals    . Healthy Lifestyle     Continue to eat heart healthy diet (full of fruits, vegetables, whole grains, lean protein, water--limit salt, fat, and sugar intake) and increase physical activity as tolerated.       Fall Risk Fall Risk  07/24/2018 02/25/2017 02/02/2017  Falls in the past year? No No No    Depression Screen PHQ 2/9 Scores 07/24/2018 02/25/2017 11/26/2016  PHQ - 2 Score 0 0 0  PHQ- 9 Score - - 2     Cognitive Function MMSE - Mini Mental State Exam 07/24/2018 02/25/2017  Orientation to time 5 5  Orientation to Place 5 5  Registration 3 3  Attention/ Calculation 4 4  Recall 2 3  Language- name 2 objects 2 2  Language- repeat 1 1  Language- follow 3 step command 3 3  Language- read & follow direction 1 1  Write a sentence 1 0  Copy design 1 0  Total score 28 27         There is no immunization history on file for this patient.   Screening Tests Health Maintenance  Topic Date Due  . Hepatitis C Screening  09-15-1961  . HIV Screening  05/31/1975  . TETANUS/TDAP  05/31/1979  . MAMMOGRAM  05/30/2010  . COLONOSCOPY  05/30/2010  . PAP SMEAR  07/26/2015  . INFLUENZA VACCINE  06/15/2018       Plan:    Please schedule your next medicare wellness visit with me in 1 yr.  Continue to eat heart healthy diet (full of fruits, vegetables, whole grains, lean protein, water--limit salt, fat, and sugar intake) and increase physical activity as tolerated.  Continue doing brain stimulating activities (puzzles, reading,  adult coloring books, staying active) to keep memory sharp.    I have personally reviewed and noted the following in the patient's chart:   . Medical and social history . Use of alcohol, tobacco or illicit drugs  . Current medications and supplements . Functional ability and status . Nutritional status . Physical activity . Advanced directives . List of other physicians . Hospitalizations, surgeries, and ER visits in previous 12 months . Vitals . Screenings to include cognitive, depression, and falls . Referrals and appointments  In addition, I have reviewed and discussed with patient certain preventive protocols, quality metrics, and best practice recommendations. A written personalized care plan for preventive services as well as general preventive health recommendations were provided to patient.     Avon Gully, [place]  07/24/2018

## 2018-07-24 ENCOUNTER — Ambulatory Visit (INDEPENDENT_AMBULATORY_CARE_PROVIDER_SITE_OTHER): Payer: PPO | Admitting: Family

## 2018-07-24 ENCOUNTER — Encounter: Payer: Self-pay | Admitting: Family

## 2018-07-24 ENCOUNTER — Encounter: Payer: Self-pay | Admitting: *Deleted

## 2018-07-24 ENCOUNTER — Other Ambulatory Visit (HOSPITAL_COMMUNITY)
Admission: RE | Admit: 2018-07-24 | Discharge: 2018-07-24 | Disposition: A | Discharge status: Home / Self Care | Payer: PPO | Source: Ambulatory Visit | Attending: Family | Admitting: Family

## 2018-07-24 ENCOUNTER — Ambulatory Visit (INDEPENDENT_AMBULATORY_CARE_PROVIDER_SITE_OTHER): Payer: PPO | Admitting: *Deleted

## 2018-07-24 VITALS — BP 132/88 | HR 75 | Temp 98.2°F | Resp 16 | Ht 61.0 in | Wt 174.0 lb

## 2018-07-24 VITALS — BP 142/90 | HR 70 | Ht 61.0 in | Wt 173.8 lb

## 2018-07-24 DIAGNOSIS — E2839 Other primary ovarian failure: Secondary | ICD-10-CM

## 2018-07-24 DIAGNOSIS — Z124 Encounter for screening for malignant neoplasm of cervix: Secondary | ICD-10-CM

## 2018-07-24 DIAGNOSIS — Z1239 Encounter for other screening for malignant neoplasm of breast: Secondary | ICD-10-CM

## 2018-07-24 DIAGNOSIS — I10 Essential (primary) hypertension: Secondary | ICD-10-CM | POA: Diagnosis not present

## 2018-07-24 DIAGNOSIS — Z8673 Personal history of transient ischemic attack (TIA), and cerebral infarction without residual deficits: Secondary | ICD-10-CM

## 2018-07-24 DIAGNOSIS — Z0000 Encounter for general adult medical examination without abnormal findings: Secondary | ICD-10-CM

## 2018-07-24 NOTE — Addendum Note (Signed)
Addended by: Wilford Corner on: 07/24/2018 03:31 PM   Modules accepted: Orders

## 2018-07-24 NOTE — Progress Notes (Signed)
Subjective:    Patient ID: Latasha [name], female    DOB: 08/10/1961, 56 y.o.   MRN: [id number]  HPI  [name] is a 57 yr old female who presents today for follow up.    HTN- maintained on amlodipine 5mg    BP Readings from Last 3 Encounters:  07/24/18 132/88  07/24/18 (!) 142/90  02/25/17 138/86   Patient presents today for complete physical.  Immunizations:  Declines Colonoscopy: declines.   Dexa: due Pap Smear:  due Mammogram:  Declines  `   Review of Systems  Constitutional: Negative for unexpected weight change.  HENT: Negative for rhinorrhea.   Respiratory: Negative for cough.   Gastrointestinal: Negative for constipation and diarrhea.  Genitourinary: Negative for dysuria and frequency.  Neurological: Negative for headaches.  Psychiatric/Behavioral:       Denies depression   See HPI  Past Medical History:  Diagnosis Date  . History of kidney stones   . Seizures (HCC)    from childhood  . Stroke [facility])    57 years old.     Social History   Socioeconomic History  . Marital status: Single    Spouse name: Not on file  . Number of children: 3  . Years of education: Not on file  . Highest education level: Not on file  Occupational History  . Not on file  Social Needs  . Financial resource strain: Not on file  . Food insecurity:    Worry: Not on file    Inability: Not on file  . Transportation needs:    Medical: Not on file    Non-medical: Not on file  Tobacco Use  . Smoking status: Never Smoker  . Smokeless tobacco: Never Used  Substance and Sexual Activity  . Alcohol use: No  . Drug use: No  . Sexual activity: Yes  Lifestyle  . Physical activity:    Days per week: Not on file    Minutes per session: Not on file  . Stress: Not on file  Relationships  . Social connections:    Talks on phone: Not on file    Gets together: Not on file    Attends religious service: Not on file    Active member of club or organization: Not on file   Attends meetings of clubs or organizations: Not on file    Relationship status: Not on file  . Intimate partner violence:    Fear of current or ex partner: Not on file    Emotionally abused: Not on file    Physically abused: Not on file    Forced sexual activity: Not on file  Other Topics Concern  . Not on file  Social History Narrative   Regular exercise:  No   Caffeine use: 1 soda daily   3 children   Lives with mother             Past Surgical History:  Procedure Laterality Date  . APPENDECTOMY     57 years old    Family History  Problem Relation Age of Onset  . Arthritis Mother   . Hyperlipidemia Mother   . Hypertension Mother   . Diabetes Mother   . Stroke Mother   . Arthritis Father   . Hypertension Father   . Arthritis Sister   . Hyperlipidemia Sister     Allergies  Allergen Reactions  . Penicillins Rash    Current Outpatient Medications on File Prior to Visit  Medication Sig Dispense Refill  .  amLODipine (NORVASC) 5 MG tablet Take 1 tablet (5 mg total) by mouth daily. 30 tablet 5  . aspirin EC 81 MG tablet Take 81 mg by mouth daily.    . Calcium Carbonate-Vitamin D (CALTRATE 600+D) 600-400 MG-UNIT per tablet Take 1 tablet by mouth 2 (two) times daily.     No current facility-administered medications on file prior to visit.     BP 132/88 (BP Location: Right Arm, Patient Position: Sitting, Cuff Size: Small)   Pulse 75   Temp 98.2 F (36.8 C) (Oral)   Resp 16   Ht 5\' 1"  (1.549 m)   Wt 174 lb (78.9 kg)   LMP 11/15/1957   SpO2 97%   BMI 32.88 kg/m       Objective:   Physical Exam  Physical Exam  Constitutional: She is oriented to person, place, and time. She appears well-developed and well-nourished. No distress.  HENT:  Head: Normocephalic and atraumatic.  Right Ear: Tympanic membrane and ear canal normal.  Left Ear: Tympanic membrane and ear canal normal.  Mouth/Throat: Oropharynx is clear and moist.  Eyes: Pupils are equal, round, and  reactive to light. No scleral icterus.  Neck: Normal range of motion. No thyromegaly present.  Cardiovascular: Normal rate and regular rhythm.   No murmur heard. Pulmonary/Chest: Effort normal and breath sounds normal. No respiratory distress. He has no wheezes. She has no rales. She exhibits no tenderness.  Abdominal: Soft. Bowel sounds are normal. She exhibits no distension and no mass. There is no tenderness. There is no rebound and no guarding.  Musculoskeletal: She exhibits no edema.  Lymphadenopathy:    She has no cervical adenopathy.  Neurological: She is alert and oriented to person, place, and time. She has normal patellar reflexes. She exhibits normal muscle tone. Coordination normal.  Skin: Skin is warm and dry.  Psychiatric: calm, pleasant Breasts: Examined lying Right: Without masses, retractions, discharge or axillary adenopathy.  Left: Without masses, retractions, discharge or axillary adenopathy.  Inguinal/mons: Normal without inguinal adenopathy  External genitalia: Normal  BUS/Urethra/Skene's glands: Normal  Bladder: Normal  Vagina: Normal  Cervix: Normal  Uterus: normal in size, shape and contour. Midline and mobile  Adnexa/parametria:  Rt: Without masses or tenderness.  Lt: Without masses or tenderness.  Anus and perineum: Normal            Assessment & Plan:  Preventative care- declines all immunizations.  Refer for dexa/colo/mammo. Pap performed today.    HTN- obtain follow up cmet. Continue lisinopril.    Hx of CVA- continue ASA, check lipid panel.         Assessment & Plan:

## 2018-07-24 NOTE — Patient Instructions (Signed)
Please schedule your next medicare wellness visit with me in 1 yr.  Continue to eat heart healthy diet (full of fruits, vegetables, whole grains, lean protein, water--limit salt, fat, and sugar intake) and increase physical activity as tolerated.  Continue doing brain stimulating activities (puzzles, reading, adult coloring books, staying active) to keep memory sharp.    Latasha [name] , Thank you for taking time to come for your Medicare Wellness Visit. I appreciate your ongoing commitment to your health goals. Please review the following plan we discussed and let me know if I can assist you in the future.   These are the goals we discussed: Goals    . Healthy Lifestyle     Continue to eat heart healthy diet (full of fruits, vegetables, whole grains, lean protein, water--limit salt, fat, and sugar intake) and increase physical activity as tolerated.       This is a list of the screening recommended for you and due dates:  Health Maintenance  Topic Date Due  .  Hepatitis C: One time screening is recommended by Center for Disease Control  (CDC) for  adults born from [birth year] through 1966.   [birth year]  . HIV Screening  05/31/1975  . Tetanus Vaccine  05/31/1979  . Mammogram  05/30/2010  . Colon Cancer Screening  05/30/2010  . Pap Smear  07/26/2015  . Flu Shot  06/15/2018    Health Maintenance for Postmenopausal Women Menopause is a normal process in which your reproductive ability comes to an end. This process happens gradually over a span of months to years, usually between the ages of 43 and 47. Menopause is complete when you have missed 12 consecutive menstrual periods. It is important to talk with your health care provider about some of the most common conditions that affect postmenopausal women, such as heart disease, cancer, and bone loss (osteoporosis). Adopting a healthy lifestyle and getting preventive care can help to promote your health and wellness. Those actions can also lower your  chances of developing some of these common conditions. What should I know about menopause? During menopause, you may experience a number of symptoms, such as:  Moderate-to-severe hot flashes.  Night sweats.  Decrease in sex drive.  Mood swings.  Headaches.  Tiredness.  Irritability.  Memory problems.  Insomnia.  Choosing to treat or not to treat menopausal changes is an individual decision that you make with your health care provider. What should I know about hormone replacement therapy and supplements? Hormone therapy products are effective for treating symptoms that are associated with menopause, such as hot flashes and night sweats. Hormone replacement carries certain risks, especially as you become older. If you are thinking about using estrogen or estrogen with progestin treatments, discuss the benefits and risks with your health care provider. What should I know about heart disease and stroke? Heart disease, heart attack, and stroke become more likely as you age. This may be due, in part, to the hormonal changes that your body experiences during menopause. These can affect how your body processes dietary fats, triglycerides, and cholesterol. Heart attack and stroke are both medical emergencies. There are many things that you can do to help prevent heart disease and stroke:  Have your blood pressure checked at least every 1-2 years. High blood pressure causes heart disease and increases the risk of stroke.  If you are 58-22 years old, ask your health care provider if you should take aspirin to prevent a heart attack or a stroke.  Do not  use any tobacco products, including cigarettes, chewing tobacco, or electronic cigarettes. If you need help quitting, ask your health care provider.  It is important to eat a healthy diet and maintain a healthy weight. ? Be sure to include plenty of vegetables, fruits, low-fat dairy products, and lean protein. ? Avoid eating foods that are  high in solid fats, added sugars, or salt (sodium).  Get regular exercise. This is one of the most important things that you can do for your health. ? Try to exercise for at least 150 minutes each week. The type of exercise that you do should increase your heart rate and make you sweat. This is known as moderate-intensity exercise. ? Try to do strengthening exercises at least twice each week. Do these in addition to the moderate-intensity exercise.  Know your numbers.Ask your health care provider to check your cholesterol and your blood glucose. Continue to have your blood tested as directed by your health care provider.  What should I know about cancer screening? There are several types of cancer. Take the following steps to reduce your risk and to catch any cancer development as early as possible. Breast Cancer  Practice breast self-awareness. ? This means understanding how your breasts normally appear and feel. ? It also means doing regular breast self-exams. Let your health care provider know about any changes, no matter how small.  If you are 40 or older, have a clinician do a breast exam (clinical breast exam or CBE) every year. Depending on your age, family history, and medical history, it may be recommended that you also have a yearly breast X-ray (mammogram).  If you have a family history of breast cancer, talk with your health care provider about genetic screening.  If you are at high risk for breast cancer, talk with your health care provider about having an MRI and a mammogram every year.  Breast cancer (BRCA) gene test is recommended for women who have family members with BRCA-related cancers. Results of the assessment will determine the need for genetic counseling and BRCA1 and for BRCA2 testing. BRCA-related cancers include these types: ? Breast. This occurs in males or females. ? Ovarian. ? Tubal. This may also be called fallopian tube cancer. ? Cancer of the abdominal or  pelvic lining (peritoneal cancer). ? Prostate. ? Pancreatic.  Cervical, Uterine, and Ovarian Cancer Your health care provider may recommend that you be screened regularly for cancer of the pelvic organs. These include your ovaries, uterus, and vagina. This screening involves a pelvic exam, which includes checking for microscopic changes to the surface of your cervix (Pap test).  For women ages 21-65, health care providers may recommend a pelvic exam and a Pap test every three years. For women ages 30-65, they may recommend the Pap test and pelvic exam, combined with testing for human papilloma virus (HPV), every five years. Some types of HPV increase your risk of cervical cancer. Testing for HPV may also be done on women of any age who have unclear Pap test results.  Other health care providers may not recommend any screening for nonpregnant women who are considered low risk for pelvic cancer and have no symptoms. Ask your health care provider if a screening pelvic exam is right for you.  If you have had past treatment for cervical cancer or a condition that could lead to cancer, you need Pap tests and screening for cancer for at least 20 years after your treatment. If Pap tests have been discontinued for you,   your risk factors (such as having a new sexual partner) need to be reassessed to determine if you should start having screenings again. Some women have medical problems that increase the chance of getting cervical cancer. In these cases, your health care provider may recommend that you have screening and Pap tests more often.  If you have a family history of uterine cancer or ovarian cancer, talk with your health care provider about genetic screening.  If you have vaginal bleeding after reaching menopause, tell your health care provider.  There are currently no reliable tests available to screen for ovarian cancer.  Lung Cancer Lung cancer screening is recommended for adults 52-27 years old  who are at high risk for lung cancer because of a history of smoking. A yearly low-dose CT scan of the lungs is recommended if you:  Currently smoke.  Have a history of at least 30 pack-years of smoking and you currently smoke or have quit within the past 15 years. A pack-year is smoking an average of one pack of cigarettes per day for one year.  Yearly screening should:  Continue until it has been 15 years since you quit.  Stop if you develop a health problem that would prevent you from having lung cancer treatment.  Colorectal Cancer  This type of cancer can be detected and can often be prevented.  Routine colorectal cancer screening usually begins at age 55 and continues through age 28.  If you have risk factors for colon cancer, your health care provider may recommend that you be screened at an earlier age.  If you have a family history of colorectal cancer, talk with your health care provider about genetic screening.  Your health care provider may also recommend using home test kits to check for hidden blood in your stool.  A small camera at the end of a tube can be used to examine your colon directly (sigmoidoscopy or colonoscopy). This is done to check for the earliest forms of colorectal cancer.  Direct examination of the colon should be repeated every 5-10 years until age 19. However, if early forms of precancerous polyps or small growths are found or if you have a family history or genetic risk for colorectal cancer, you may need to be screened more often.  Skin Cancer  Check your skin from head to toe regularly.  Monitor any moles. Be sure to tell your health care provider: ? About any new moles or changes in moles, especially if there is a change in a mole's shape or color. ? If you have a mole that is larger than the size of a pencil eraser.  If any of your family members has a history of skin cancer, especially at a young age, talk with your health care provider  about genetic screening.  Always use sunscreen. Apply sunscreen liberally and repeatedly throughout the day.  Whenever you are outside, protect yourself by wearing long sleeves, pants, a wide-brimmed hat, and sunglasses.  What should I know about osteoporosis? Osteoporosis is a condition in which bone destruction happens more quickly than new bone creation. After menopause, you may be at an increased risk for osteoporosis. To help prevent osteoporosis or the bone fractures that can happen because of osteoporosis, the following is recommended:  If you are 72-85 years old, get at least 1,000 mg of calcium and at least 600 mg of vitamin D per day.  If you are older than age 25 but younger than age 57, get at least  1,200 mg of calcium and at least 600 mg of vitamin D per day.  If you are older than age 70, get at least 1,200 mg of calcium and at least 800 mg of vitamin D per day.  Smoking and excessive alcohol intake increase the risk of osteoporosis. Eat foods that are rich in calcium and vitamin D, and do weight-bearing exercises several times each week as directed by your health care provider. What should I know about how menopause affects my mental health? Depression may occur at any age, but it is more common as you become older. Common symptoms of depression include:  Low or sad mood.  Changes in sleep patterns.  Changes in appetite or eating patterns.  Feeling an overall lack of motivation or enjoyment of activities that you previously enjoyed.  Frequent crying spells.  Talk with your health care provider if you think that you are experiencing depression. What should I know about immunizations? It is important that you get and maintain your immunizations. These include:  Tetanus, diphtheria, and pertussis (Tdap) booster vaccine.  Influenza every year before the flu season begins.  Pneumonia vaccine.  Shingles vaccine.  Your health care provider may also recommend other  immunizations. This information is not intended to replace advice given to you by your health care provider. Make sure you discuss any questions you have with your health care provider. Document Released: 12/24/2005 Document Revised: 05/21/2016 Document Reviewed: 08/05/2015 Elsevier Interactive Patient Education  2018 [employer]  

## 2018-07-24 NOTE — Progress Notes (Signed)
RN note reviewed and agree.  

## 2018-07-24 NOTE — Patient Instructions (Signed)
Please complete lab work prior to leaving.   

## 2018-07-26 LAB — CYTOLOGY - PAP
DIAGNOSIS: NEGATIVE
HPV: NOT DETECTED

## 2018-08-01 ENCOUNTER — Telehealth: Payer: Self-pay

## 2018-08-01 NOTE — Telephone Encounter (Signed)
-----   Message from Sandford CrazeMelissa O'Sullivan, NP sent at 07/27/2018  1:58 PM EDT ----- Pap is negative. Looks like she forgot to go to the lab. Let's have her return for lab visit.

## 2018-08-01 NOTE — Telephone Encounter (Signed)
Author phoned pt. To relay pap results and remind her about pending labs, no answer, unable to leave VM or mychart message. Will try again later.

## 2018-08-31 ENCOUNTER — Ambulatory Visit (HOSPITAL_BASED_OUTPATIENT_CLINIC_OR_DEPARTMENT_OTHER)
Admission: RE | Admit: 2018-08-31 | Discharge: 2018-08-31 | Disposition: A | Discharge status: Home / Self Care | Payer: PPO | Source: Ambulatory Visit | Attending: Family | Admitting: Family

## 2018-08-31 DIAGNOSIS — M8588 Other specified disorders of bone density and structure, other site: Secondary | ICD-10-CM | POA: Diagnosis not present

## 2018-08-31 DIAGNOSIS — Z780 Asymptomatic menopausal state: Secondary | ICD-10-CM | POA: Diagnosis not present

## 2018-08-31 DIAGNOSIS — R928 Other abnormal and inconclusive findings on diagnostic imaging of breast: Secondary | ICD-10-CM | POA: Diagnosis not present

## 2018-08-31 DIAGNOSIS — E2839 Other primary ovarian failure: Secondary | ICD-10-CM | POA: Insufficient documentation

## 2018-08-31 DIAGNOSIS — Z1231 Encounter for screening mammogram for malignant neoplasm of breast: Secondary | ICD-10-CM | POA: Diagnosis not present

## 2018-08-31 DIAGNOSIS — Z1239 Encounter for other screening for malignant neoplasm of breast: Secondary | ICD-10-CM

## 2018-08-31 DIAGNOSIS — Z13820 Encounter for screening for osteoporosis: Secondary | ICD-10-CM | POA: Insufficient documentation

## 2018-09-01 ENCOUNTER — Encounter: Payer: Self-pay | Admitting: Family

## 2018-09-04 ENCOUNTER — Other Ambulatory Visit: Payer: Self-pay | Admitting: Family

## 2018-09-04 DIAGNOSIS — R928 Other abnormal and inconclusive findings on diagnostic imaging of breast: Secondary | ICD-10-CM

## 2018-09-05 ENCOUNTER — Telehealth: Payer: Self-pay | Admitting: *Deleted

## 2018-09-05 NOTE — Telephone Encounter (Signed)
Received Physician Orders from [facility]; forwarded to provider/SLS 10/22

## 2018-09-12 ENCOUNTER — Ambulatory Visit
Admission: RE | Admit: 2018-09-12 | Discharge: 2018-09-12 | Disposition: A | Discharge status: Home / Self Care | Payer: PPO | Source: Ambulatory Visit | Attending: Family | Admitting: Family

## 2018-09-12 ENCOUNTER — Telehealth: Payer: Self-pay

## 2018-09-12 DIAGNOSIS — N6320 Unspecified lump in the left breast, unspecified quadrant: Secondary | ICD-10-CM | POA: Diagnosis not present

## 2018-09-12 DIAGNOSIS — R928 Other abnormal and inconclusive findings on diagnostic imaging of breast: Secondary | ICD-10-CM | POA: Diagnosis not present

## 2018-09-12 NOTE — Telephone Encounter (Signed)
Mother, Corrie Dandy, calling to understand why pt. was having a second mammogram, and author explained that Efraim Kaufmann was concerned about a mass found in the left breast. Mother verbalized understanding.

## 2019-07-26 NOTE — Progress Notes (Deleted)
Virtual Visit via Video Note  I connected with patient on 07/27/19 at  2:30 PM EDT by audio enabled telemedicine application and verified that I am speaking with the correct person using two identifiers.   THIS ENCOUNTER IS A VIRTUAL VISIT DUE TO COVID-19 - PATIENT WAS NOT SEEN IN THE OFFICE. PATIENT HAS CONSENTED TO VIRTUAL VISIT / TELEMEDICINE VISIT   Location of patient: home  Location of provider: office  I discussed the limitations of evaluation and management by telemedicine and the availability of in person appointments. The patient expressed understanding and agreed to proceed.   Subjective:   Latasha [name] is a 58 y.o. female who presents for Medicare Annual (Subsequent) preventive examination.  Review of Systems:     Home Safety/Smoke Alarms: Feels safe in home. Smoke alarms in place.  Lives with mother, sister, and daughter.   Female:   Pap- 07/24/18      Mammo-  09/12/18     Dexa scan-08/31/18 [facility]- decline     Objective:     Vitals: LMP 11/15/1958   There is no height or weight on file to calculate BMI.  Advanced Directives 07/24/2018 02/25/2017  Does Patient Have a Medical Advance Directive? No Yes  Type of Advance Directive - [place]  Does patient want to make changes to medical advance directive? Yes (MAU/Ambulatory/Procedural Areas - Information given) -  Copy of [place] in Chart? - No - copy requested    Tobacco Social History   Tobacco Use  Smoking Status Never Smoker  Smokeless Tobacco Never Used     Counseling given: Not Answered   Clinical Intake:                       Past Medical History:  Diagnosis Date  . History of kidney stones   . Seizures ([place])    from childhood  . Stroke [facility])    58 years old.   Past Surgical History:  Procedure Laterality Date  . APPENDECTOMY     58 years old   Family History  Problem Relation Age of Onset  . Arthritis Mother   . Hyperlipidemia  Mother   . Hypertension Mother   . Diabetes Mother   . Stroke Mother   . Arthritis Father   . Hypertension Father   . Arthritis Sister   . Hyperlipidemia Sister    Social History   Socioeconomic History  . Marital status: Single    Spouse name: Not on file  . Number of children: 3  . Years of education: Not on file  . Highest education level: Not on file  Occupational History  . Not on file  Social Needs  . Financial resource strain: Not on file  . Food insecurity    Worry: Not on file    Inability: Not on file  . Transportation needs    Medical: Not on file    Non-medical: Not on file  Tobacco Use  . Smoking status: Never Smoker  . Smokeless tobacco: Never Used  Substance and Sexual Activity  . Alcohol use: No  . Drug use: No  . Sexual activity: Yes  Lifestyle  . Physical activity    Days per week: Not on file    Minutes per session: Not on file  . Stress: Not on file  Relationships  . Social Herbalist on phone: Not on file    Gets together: Not on file  Attends religious service: Not on file    Active member of club or organization: Not on file    Attends meetings of clubs or organizations: Not on file    Relationship status: Not on file  Other Topics Concern  . Not on file  Social History Narrative   Regular exercise:  No   Caffeine use: 1 soda daily   3 children   Lives with mother             Outpatient Encounter Medications as of 07/27/2019  Medication Sig  . amLODipine (NORVASC) 5 MG tablet Take 1 tablet (5 mg total) by mouth daily.  Marland Kitchen. aspirin EC 81 MG tablet Take 81 mg by mouth daily.  . Calcium Carbonate-Vitamin D (CALTRATE 600+D) 600-400 MG-UNIT per tablet Take 1 tablet by mouth 2 (two) times daily.   No facility-administered encounter medications on file as of 07/27/2019.     Activities of Daily Living No flowsheet data found.  Patient Care Team: Sandford Craze'Sullivan, Melissa, NP as PCP - General (Internal Medicine) Van ClinesAquino, Karen M,  MD as Consulting Physician (Neurology)    Assessment:   This is a routine wellness examination for Latasha [name]. Physical assessment deferred to PCP.   Exercise Activities and Dietary recommendations   Diet (meal preparation, eat out, water intake, caffeinated beverages, dairy products, fruits and vegetables): {Desc; diets:16563} Breakfast: Lunch:  Dinner:      Goals    . Healthy Lifestyle     Continue to eat heart healthy diet (full of fruits, vegetables, whole grains, lean protein, water--limit salt, fat, and sugar intake) and increase physical activity as tolerated.       Fall Risk Fall Risk  07/24/2018 02/25/2017 02/02/2017  Falls in the past year? No No No     Depression Screen PHQ 2/9 Scores 07/24/2018 02/25/2017 11/26/2016  PHQ - 2 Score 0 0 0  PHQ- 9 Score - - 2     Cognitive Function Ad8 score reviewed for issues:  Issues making decisions:  Less interest in hobbies / activities:  Repeats questions, stories (family complaining):  Trouble using ordinary gadgets (microwave, computer, phone):  Forgets the month or year:   Mismanaging finances:   Remembering appts:  Daily problems with thinking and/or memory: Ad8 score is=     MMSE - Mini Mental State Exam 07/24/2018 02/25/2017  Orientation to time 5 5  Orientation to Place 5 5  Registration 3 3  Attention/ Calculation 4 4  Recall 2 3  Language- name 2 objects 2 2  Language- repeat 1 1  Language- follow 3 step command 3 3  Language- read & follow direction 1 1  Write a sentence 1 0  Copy design 1 0  Total score 28 27         There is no immunization history on file for this patient.   Screening Tests Health Maintenance  Topic Date Due  . Hepatitis C Screening  Jun 04, 1961  . HIV Screening  05/31/1975  . TETANUS/TDAP  05/31/1979  . COLONOSCOPY  05/30/2010  . INFLUENZA VACCINE  06/16/2019  . MAMMOGRAM  08/31/2020  . PAP SMEAR-Modifier  07/24/2021        Plan:   ***   I have personally  reviewed and noted the following in the patient's chart:   . Medical and social history . Use of alcohol, tobacco or illicit drugs  . Current medications and supplements . Functional ability and status . Nutritional status . Physical activity . Advanced directives . List  of other physicians . Hospitalizations, surgeries, and ER visits in previous 12 months . Vitals . Screenings to include cognitive, depression, and falls . Referrals and appointments  In addition, I have reviewed and discussed with patient certain preventive protocols, quality metrics, and best practice recommendations. A written personalized care plan for preventive services as well as general preventive health recommendations were provided to patient.     [place], [place]  07/26/2019

## 2019-07-27 ENCOUNTER — Other Ambulatory Visit: Payer: Self-pay

## 2019-07-27 ENCOUNTER — Ambulatory Visit: Payer: PPO | Admitting: *Deleted

## 2019-07-30 NOTE — Progress Notes (Signed)
Virtual Visit via Video Note  I connected with patient on 07/31/19 at  2:00 PM EDT by audio enabled telemedicine application and verified that I am speaking with the correct person using two identifiers.   THIS ENCOUNTER IS A VIRTUAL VISIT DUE TO COVID-19 - PATIENT WAS NOT SEEN IN THE OFFICE. PATIENT HAS CONSENTED TO VIRTUAL VISIT / TELEMEDICINE VISIT   Location of patient: home  Location of provider: office  I discussed the limitations of evaluation and management by telemedicine and the availability of in person appointments. The patient expressed understanding and agreed to proceed.   Subjective:   Latasha [name] is a 58 y.o. female who presents for Medicare Annual (Subsequent) preventive examination.  Review of Systems:  Cardiac Risk Factors include: advanced age (>83men, >85 women);obesity (BMI >30kg/m2) Home Safety/Smoke Alarms: Feels safe in home. Smoke alarms in place.  Lives with mother, sister, and daughter. 1 story home. Ramp going into home.  Female:   Pap- 07/24/18      Mammo- 09/12/18       Dexa scan-  08/31/18      CCS- declines     Objective:     Vitals: Unable to assess. This visit is enabled though telemedicine due to Covid 19.   Advanced Directives 07/31/2019 07/24/2018 02/25/2017  Does Patient Have a Medical Advance Directive? No No Yes  Type of Advance Directive - - [place]  Does patient want to make changes to medical advance directive? - Yes (MAU/Ambulatory/Procedural Areas - Information given) -  Copy of [place] in Chart? - - No - copy requested  Would patient like information on creating a medical advance directive? No - Patient declined - -    Tobacco Social History   Tobacco Use  Smoking Status Never Smoker  Smokeless Tobacco Never Used     Counseling given: Not Answered   Clinical Intake:     Pain : No/denies pain                 Past Medical History:  Diagnosis Date  . History of  kidney stones   . Seizures ([place])    from childhood  . Stroke [facility])    58 years old.   Past Surgical History:  Procedure Laterality Date  . APPENDECTOMY     58 years old   Family History  Problem Relation Age of Onset  . Arthritis Mother   . Hyperlipidemia Mother   . Hypertension Mother   . Diabetes Mother   . Stroke Mother   . Arthritis Father   . Hypertension Father   . Arthritis Sister   . Hyperlipidemia Sister    Social History   Socioeconomic History  . Marital status: Single    Spouse name: Not on file  . Number of children: 3  . Years of education: Not on file  . Highest education level: Not on file  Occupational History  . Not on file  Social Needs  . Financial resource strain: Not on file  . Food insecurity    Worry: Not on file    Inability: Not on file  . Transportation needs    Medical: Not on file    Non-medical: Not on file  Tobacco Use  . Smoking status: Never Smoker  . Smokeless tobacco: Never Used  Substance and Sexual Activity  . Alcohol use: No  . Drug use: No  . Sexual activity: Yes  Lifestyle  . Physical activity    Days  per week: Not on file    Minutes per session: Not on file  . Stress: Not on file  Relationships  . Social Musicianconnections    Talks on phone: Not on file    Gets together: Not on file    Attends religious service: Not on file    Active member of club or organization: Not on file    Attends meetings of clubs or organizations: Not on file    Relationship status: Not on file  Other Topics Concern  . Not on file  Social History Narrative   Regular exercise:  No   Caffeine use: 1 soda daily   3 children   Lives with mother             Outpatient Encounter Medications as of 07/31/2019  Medication Sig  . amLODipine (NORVASC) 5 MG tablet Take 1 tablet (5 mg total) by mouth daily.  Marland Kitchen. aspirin EC 81 MG tablet Take 81 mg by mouth daily.  . Calcium Carbonate-Vitamin D (CALTRATE 600+D) 600-400 MG-UNIT per tablet Take 1 tablet  by mouth 2 (two) times daily.   No facility-administered encounter medications on file as of 07/31/2019.     Activities of Daily Living In your present state of health, do you have any difficulty performing the following activities: 07/31/2019  Hearing? N  Vision? N  Difficulty concentrating or making decisions? N  Walking or climbing stairs? N  Dressing or bathing? N  Doing errands, shopping? Y  Preparing Food and eating ? N  Using the Toilet? N  In the past six months, have you accidently leaked urine? N  Do you have problems with loss of bowel control? N  Managing your Medications? N  Managing your Finances? N  Housekeeping or managing your Housekeeping? Y  Some recent data might be hidden    Patient Care Team: Sandford Craze'Sullivan, Melissa, NP as PCP - General (Internal Medicine) Van ClinesAquino, Karen M, MD as Consulting Physician (Neurology)    Assessment:   This is a routine wellness examination for Latasha [name]. Physical assessment deferred to PCP.  Exercise Activities and Dietary recommendations Current Exercise Habits: Home exercise routine, Type of exercise: walking, Time (Minutes): 20, Frequency (Times/Week): 7, Weekly Exercise (Minutes/Week): 140, Exercise limited by: None identified  Diet (meal preparation, eat out, water intake, caffeinated beverages, dairy products, fruits and vegetables): 24 hr recall Breakfast: fruit and egg Lunch: bologna sandwich Dinner: beef stew.  Drinks water throughout the day.  Goals    . Healthy Lifestyle     Continue to eat heart healthy diet (full of fruits, vegetables, whole grains, lean protein, water--limit salt, fat, and sugar intake) and increase physical activity as tolerated.       Fall Risk Fall Risk  07/31/2019 07/24/2018 02/25/2017 02/02/2017  Falls in the past year? 0 No No No    Depression Screen PHQ 2/9 Scores 07/31/2019 07/24/2018 02/25/2017 11/26/2016  PHQ - 2 Score 0 0 0 0  PHQ- 9 Score - - - 2     Cognitive Function Ad8 score reviewed  for issues:  Issues making decisions:no  Less interest in hobbies / activities:no  Repeats questions, stories (family complaining):no  Trouble using ordinary gadgets (microwave, computer, phone):no  Forgets the month or year: no  Mismanaging finances: no  Remembering appts:no  Daily problems with thinking and/or memory: no Ad8 score is=0    MMSE - Mini Mental State Exam 07/24/2018 02/25/2017  Orientation to time 5 5  Orientation to Place 5 5  Registration 3  3  Attention/ Calculation 4 4  Recall 2 3  Language- name 2 objects 2 2  Language- repeat 1 1  Language- follow 3 step command 3 3  Language- read & follow direction 1 1  Write a sentence 1 0  Copy design 1 0  Total score 28 27         There is no immunization history on file for this patient.  Screening Tests Health Maintenance  Topic Date Due  . Hepatitis C Screening  10-Aug-1961  . HIV Screening  05/31/1975  . TETANUS/TDAP  05/31/1979  . COLONOSCOPY  05/30/2010  . INFLUENZA VACCINE  06/16/2019  . MAMMOGRAM  08/31/2020  . PAP SMEAR-Modifier  07/24/2021      Plan:   See you next year!  Continue to eat heart healthy diet (full of fruits, vegetables, whole grains, lean protein, water--limit salt, fat, and sugar intake) and increase physical activity as tolerated.  Continue doing brain stimulating activities (puzzles, reading, adult coloring books, staying active) to keep memory sharp.    I have personally reviewed and noted the following in the patient's chart:   . Medical and social history . Use of alcohol, tobacco or illicit drugs  . Current medications and supplements . Functional ability and status . Nutritional status . Physical activity . Advanced directives . List of other physicians . Hospitalizations, surgeries, and ER visits in previous 12 months . Vitals . Screenings to include cognitive, depression, and falls . Referrals and appointments  In addition, I have reviewed and discussed  with patient certain preventive protocols, quality metrics, and best practice recommendations. A written personalized care plan for preventive services as well as general preventive health recommendations were provided to patient.     [place]on [place], [place]  07/31/2019

## 2019-07-31 ENCOUNTER — Ambulatory Visit (INDEPENDENT_AMBULATORY_CARE_PROVIDER_SITE_OTHER): Payer: PPO | Admitting: *Deleted

## 2019-07-31 ENCOUNTER — Other Ambulatory Visit: Payer: Self-pay

## 2019-07-31 ENCOUNTER — Encounter: Payer: Self-pay | Admitting: *Deleted

## 2019-07-31 DIAGNOSIS — Z0000 Encounter for general adult medical examination without abnormal findings: Secondary | ICD-10-CM | POA: Diagnosis not present

## 2019-07-31 NOTE — Patient Instructions (Signed)
See you next year!  Continue to eat heart healthy diet (full of fruits, vegetables, whole grains, lean protein, water--limit salt, fat, and sugar intake) and increase physical activity as tolerated.  Continue doing brain stimulating activities (puzzles, reading, adult coloring books, staying active) to keep memory sharp.    Latasha [name] , Thank you for taking time to come for your Medicare Wellness Visit. I appreciate your ongoing commitment to your health goals. Please review the following plan we discussed and let me know if I can assist you in the future.   These are the goals we discussed: Goals    . Healthy Lifestyle     Continue to eat heart healthy diet (full of fruits, vegetables, whole grains, lean protein, water--limit salt, fat, and sugar intake) and increase physical activity as tolerated.       This is a list of the screening recommended for you and due dates:  Health Maintenance  Topic Date Due  .  Hepatitis C: One time screening is recommended by Center for Disease Control  (CDC) for  adults born from [birth year] through 1966.   1961/10/30  . HIV Screening  05/31/1975  . Tetanus Vaccine  05/31/1979  . Colon Cancer Screening  05/30/2010  . Flu Shot  06/16/2019  . Mammogram  08/31/2020  . Pap Smear  07/24/2021    Health Maintenance After Age 28 After age 50, you are at a higher risk for certain long-term diseases and infections as well as injuries from falls. Falls are a major cause of broken bones and head injuries in people who are older than age 76. Getting regular preventive care can help to keep you healthy and well. Preventive care includes getting regular testing and making lifestyle changes as recommended by your health care provider. Talk with your health care provider about:  Which screenings and tests you should have. A screening is a test that checks for a disease when you have no symptoms.  A diet and exercise plan that is right for you. What should I know about  screenings and tests to prevent falls? Screening and testing are the best ways to find a health problem early. Early diagnosis and treatment give you the best chance of managing medical conditions that are common after age 5. Certain conditions and lifestyle choices may make you more likely to have a fall. Your health care provider may recommend:  Regular vision checks. Poor vision and conditions such as cataracts can make you more likely to have a fall. If you wear glasses, make sure to get your prescription updated if your vision changes.  Medicine review. Work with your health care provider to regularly review all of the medicines you are taking, including over-the-counter medicines. Ask your health care provider about any side effects that may make you more likely to have a fall. Tell your health care provider if any medicines that you take make you feel dizzy or sleepy.  Osteoporosis screening. Osteoporosis is a condition that causes the bones to get weaker. This can make the bones weak and cause them to break more easily.  Blood pressure screening. Blood pressure changes and medicines to control blood pressure can make you feel dizzy.  Strength and balance checks. Your health care provider may recommend certain tests to check your strength and balance while standing, walking, or changing positions.  Foot health exam. Foot pain and numbness, as well as not wearing proper footwear, can make you more likely to have a fall.  Depression  screening. You may be more likely to have a fall if you have a fear of falling, feel emotionally low, or feel unable to do activities that you used to do.  Alcohol use screening. Using too much alcohol can affect your balance and may make you more likely to have a fall. What actions can I take to lower my risk of falls? General instructions  Talk with your health care provider about your risks for falling. Tell your health care provider if: ? You fall. Be sure  to tell your health care provider about all falls, even ones that seem minor. ? You feel dizzy, sleepy, or off-balance.  Take over-the-counter and prescription medicines only as told by your health care provider. These include any supplements.  Eat a healthy diet and maintain a healthy weight. A healthy diet includes low-fat dairy products, low-fat (lean) meats, and fiber from whole grains, beans, and lots of fruits and vegetables. Home safety  Remove any tripping hazards, such as rugs, cords, and clutter.  Install safety equipment such as grab bars in bathrooms and safety rails on stairs.  Keep rooms and walkways well-lit. Activity   Follow a regular exercise program to stay fit. This will help you maintain your balance. Ask your health care provider what types of exercise are appropriate for you.  If you need a cane or walker, use it as recommended by your health care provider.  Wear supportive shoes that have nonskid soles. Lifestyle  Do not drink alcohol if your health care provider tells you not to drink.  If you drink alcohol, limit how much you have: ? 0-1 drink a day for women. ? 0-2 drinks a day for men.  Be aware of how much alcohol is in your drink. In the [place] one drink equals one typical bottle of beer (12 oz), one-half glass of wine (5 oz), or one shot of hard liquor (1 oz).  Do not use any products that contain nicotine or tobacco, such as cigarettes and e-cigarettes. If you need help quitting, ask your health care provider. Summary  Having a healthy lifestyle and getting preventive care can help to protect your health and wellness after age 37.  Screening and testing are the best way to find a health problem early and help you avoid having a fall. Early diagnosis and treatment give you the best chance for managing medical conditions that are more common for people who are older than age 24.  Falls are a major cause of broken bones and head injuries in people  who are older than age 34. Take precautions to prevent a fall at home.  Work with your health care provider to learn what changes you can make to improve your health and wellness and to prevent falls. This information is not intended to replace advice given to you by your health care provider. Make sure you discuss any questions you have with your health care provider. Document Released: 09/14/2017 Document Revised: 02/22/2019 Document Reviewed: 09/14/2017 Elsevier Patient Education  2020 [employer].

## 2020-06-10 ENCOUNTER — Encounter: Payer: Self-pay | Admitting: Family

## 2020-06-10 ENCOUNTER — Ambulatory Visit (INDEPENDENT_AMBULATORY_CARE_PROVIDER_SITE_OTHER): Payer: PPO | Admitting: Family

## 2020-06-10 ENCOUNTER — Telehealth: Payer: Self-pay | Admitting: Family

## 2020-06-10 ENCOUNTER — Other Ambulatory Visit: Payer: Self-pay

## 2020-06-10 VITALS — BP 165/90 | HR 89 | Temp 99.0°F | Resp 16 | Ht 61.0 in | Wt 165.0 lb

## 2020-06-10 DIAGNOSIS — Z8673 Personal history of transient ischemic attack (TIA), and cerebral infarction without residual deficits: Secondary | ICD-10-CM

## 2020-06-10 DIAGNOSIS — I10 Essential (primary) hypertension: Secondary | ICD-10-CM

## 2020-06-10 MED ORDER — AMLODIPINE BESYLATE 5 MG PO TABS: 5.0000 mg | ORAL_TABLET | Freq: Every day | ORAL | 5 refills | Status: DC

## 2020-06-10 NOTE — Patient Instructions (Signed)
Restart amlodipine once daily.  Start aspirin 81mg  once daily.

## 2020-06-10 NOTE — Progress Notes (Signed)
Subjective:    Patient ID: Latasha [name], female    DOB: 12-May-1961, 59 y.o.   MRN: [id number]  HPI   Patient is a 59 yr old female who presents today for follow up. She is accompanied by her mother today.   HTN- Patient is not currently on medication. She has not been on medication for some time.  BP Readings from Last 3 Encounters:  06/10/20 (!) 165/90  07/24/18 132/88  07/24/18 (!) 142/90   Hx of stroke- mother states that she has not been taking aspirin.     Review of Systems See HPI  Past Medical History:  Diagnosis Date  . History of kidney stones   . Seizures ([facility])    from childhood  . Stroke [facility])    59 years old.     Social History   Socioeconomic History  . Marital status: Single    Spouse name: Not on file  . Number of children: 3  . Years of education: Not on file  . Highest education level: Not on file  Occupational History  . Not on file  Tobacco Use  . Smoking status: Never Smoker  . Smokeless tobacco: Never Used  Substance and Sexual Activity  . Alcohol use: No  . Drug use: No  . Sexual activity: Yes  Other Topics Concern  . Not on file  Social History Narrative   Regular exercise:  No   Caffeine use: 1 soda daily   3 children   Lives with mother            Social Determinants of Health   Financial Resource Strain:   . Difficulty of Paying Living Expenses:   Food Insecurity:   . Worried About Programme researcher, broadcasting/film/video in the Last Year:   . Barista in the Last Year:   Transportation Needs:   . Freight forwarder (Medical):   Marland Kitchen Lack of Transportation (Non-Medical):   Physical Activity:   . Days of Exercise per Week:   . Minutes of Exercise per Session:   Stress:   . Feeling of Stress :   Social Connections:   . Frequency of Communication with Friends and Family:   . Frequency of Social Gatherings with Friends and Family:   . Attends Religious Services:   . Active Member of Clubs or Organizations:   . Attends Tax inspector Meetings:   Marland Kitchen Marital Status:   Intimate Partner Violence:   . Fear of Current or Ex-Partner:   . Emotionally Abused:   Marland Kitchen Physically Abused:   . Sexually Abused:     Past Surgical History:  Procedure Laterality Date  . APPENDECTOMY     59 years old    Family History  Problem Relation Age of Onset  . Arthritis Mother   . Hyperlipidemia Mother   . Hypertension Mother   . Diabetes Mother   . Stroke Mother   . Arthritis Father   . Hypertension Father   . Arthritis Sister   . Hyperlipidemia Sister     Allergies  Allergen Reactions  . Penicillins Rash    Current Outpatient Medications on File Prior to Visit  Medication Sig Dispense Refill  . aspirin EC 81 MG tablet Take 81 mg by mouth daily. (Patient not taking: Reported on 06/10/2020)    . Calcium Carbonate-Vitamin D (CALTRATE 600+D) 600-400 MG-UNIT per tablet Take 1 tablet by mouth 2 (two) times daily. (Patient not taking: Reported on 06/10/2020)  No current facility-administered medications on file prior to visit.    BP (!) 165/90 (BP Location: Left Arm, Patient Position: Sitting, Cuff Size: Small)   Pulse 89   Temp 99 F (37.2 C) (Oral)   Resp 16   Ht 5\' 1"  (1.549 m)   Wt 165 lb (74.8 kg)   LMP 11/15/1957   SpO2 100%   BMI 31.18 kg/m       Objective:   Physical Exam Constitutional:      Appearance: She is well-developed.  Neck:     Thyroid: No thyromegaly.  Cardiovascular:     Rate and Rhythm: Normal rate and regular rhythm.     Heart sounds: Normal heart sounds. No murmur heard.   Pulmonary:     Effort: Pulmonary effort is normal. No respiratory distress.     Breath sounds: Normal breath sounds. No wheezing.  Musculoskeletal:     Cervical back: Neck supple.  Skin:    General: Skin is warm and dry.  Neurological:     General: No focal deficit present.     Mental Status: She is alert and oriented to person, place, and time.     Cranial Nerves: No cranial nerve deficit.     Motor:  No weakness.     Gait: Gait normal.  Psychiatric:        Behavior: Behavior normal.        Thought Content: Thought content normal.        Judgment: Judgment normal.           Assessment & Plan:  HTN- uncontrolled. Restart amlodipine. Check bmet to assess renal function.  Hx of cva- restart aspirin 81mg  once daily. Will likely benefit from a statin as well. Check lipid panel.  Discussed colonoscopy versus cologuard- pt prefers cologuard.  This visit occurred during the SARS-CoV-2 public health emergency.  Safety protocols were in place, including screening questions prior to the visit, additional usage of staff PPE, and extensive cleaning of exam room while observing appropriate contact time as indicated for disinfecting solutions.

## 2020-06-10 NOTE — Telephone Encounter (Signed)
Please initiate cologuard testing 

## 2020-06-11 LAB — BASIC METABOLIC PANEL
BUN: 8 mg/dL (ref 6–23)
CO2: 26 mEq/L (ref 19–32)
Calcium: 9.4 mg/dL (ref 8.4–10.5)
Chloride: 104 mEq/L (ref 96–112)
Creatinine, Ser: 0.64 mg/dL (ref 0.40–1.20)
GFR: 114.52 mL/min (ref >60.00)
Glucose, Bld: 89 mg/dL (ref 70–99)
Potassium: 3.7 mEq/L (ref 3.5–5.1)
Sodium: 135 mEq/L (ref 135–145)

## 2020-06-11 LAB — LIPID PANEL
Cholesterol: 153 mg/dL (ref 0–200)
HDL: 42.1 mg/dL (ref >39.00)
LDL Cholesterol: 98 mg/dL (ref 0–99)
NonHDL: 111.18
Total CHOL/HDL Ratio: 4
Triglycerides: 67 mg/dL (ref 0.0–149.0)
VLDL: 13.4 mg/dL (ref 0.0–40.0)

## 2020-06-11 NOTE — Telephone Encounter (Signed)
Cologuard order faxed to [facility] with demographics and insurance card information

## 2020-07-22 DIAGNOSIS — Z1211 Encounter for screening for malignant neoplasm of colon: Secondary | ICD-10-CM | POA: Diagnosis not present

## 2020-07-22 DIAGNOSIS — Z1212 Encounter for screening for malignant neoplasm of rectum: Secondary | ICD-10-CM | POA: Diagnosis not present

## 2020-07-22 LAB — COLOGUARD: Cologuard: NEGATIVE

## 2020-07-30 LAB — EXTERNAL GENERIC LAB PROCEDURE: COLOGUARD: NEGATIVE

## 2020-11-02 ENCOUNTER — Other Ambulatory Visit: Payer: Self-pay | Admitting: Family

## 2020-12-06 ENCOUNTER — Other Ambulatory Visit: Payer: Self-pay | Admitting: Family

## 2021-01-08 ENCOUNTER — Other Ambulatory Visit: Payer: Self-pay | Admitting: Family

## 2021-01-16 ENCOUNTER — Other Ambulatory Visit: Payer: Self-pay | Admitting: Family

## 2021-02-03 ENCOUNTER — Other Ambulatory Visit: Payer: Self-pay | Admitting: Family

## 2021-02-10 ENCOUNTER — Other Ambulatory Visit: Payer: Self-pay

## 2021-02-10 ENCOUNTER — Encounter: Payer: Self-pay | Admitting: Family

## 2021-02-10 ENCOUNTER — Ambulatory Visit (INDEPENDENT_AMBULATORY_CARE_PROVIDER_SITE_OTHER): Payer: PPO | Admitting: Family

## 2021-02-10 VITALS — BP 171/99 | HR 92 | Temp 98.5°F | Resp 16 | Wt 169.0 lb

## 2021-02-10 DIAGNOSIS — I10 Essential (primary) hypertension: Secondary | ICD-10-CM | POA: Diagnosis not present

## 2021-02-10 DIAGNOSIS — Z0000 Encounter for general adult medical examination without abnormal findings: Secondary | ICD-10-CM

## 2021-02-10 MED ORDER — AMLODIPINE BESYLATE 5 MG PO TABS: 5.0000 mg | ORAL_TABLET | Freq: Every day | ORAL | 3 refills | Status: DC

## 2021-02-10 NOTE — Progress Notes (Signed)
Subjective:    Patient ID: Latasha [name], female    DOB: 1961-04-02, 60 y.o.   MRN: [id number]  HPI  Patient is a 60 yr old female who presents today for follow up.  HTN- BP meds include amlodipine 5mg . She was overdue for follow up and ran out of her medication 1 week ago.   BP Readings from Last 3 Encounters:  02/10/21 (!) 171/99  06/10/20 (!) 165/90  07/24/18 132/88      Review of Systems    see HPI  Past Medical History:  Diagnosis Date  . History of kidney stones   . Seizures (HCC)    from childhood  . Stroke [facility])    60 years old.     Social History   Socioeconomic History  . Marital status: Single    Spouse name: Not on file  . Number of children: 3  . Years of education: Not on file  . Highest education level: Not on file  Occupational History  . Not on file  Tobacco Use  . Smoking status: Never Smoker  . Smokeless tobacco: Never Used  Substance and Sexual Activity  . Alcohol use: No  . Drug use: No  . Sexual activity: Yes  Other Topics Concern  . Not on file  Social History Narrative   Regular exercise:  No   Caffeine use: 1 soda daily   3 children   Lives with mother            Social Determinants of Health   Financial Resource Strain: Not on file  Food Insecurity: Not on file  Transportation Needs: Not on file  Physical Activity: Not on file  Stress: Not on file  Social Connections: Not on file  Intimate Partner Violence: Not on file    Past Surgical History:  Procedure Laterality Date  . APPENDECTOMY     60 years old    Family History  Problem Relation Age of Onset  . Arthritis Mother   . Hyperlipidemia Mother   . Hypertension Mother   . Diabetes Mother   . Stroke Mother   . Arthritis Father   . Hypertension Father   . Arthritis Sister   . Hyperlipidemia Sister     Allergies  Allergen Reactions  . Penicillins Rash    Current Outpatient Medications on File Prior to Visit  Medication Sig Dispense Refill  .  amLODipine (NORVASC) 5 MG tablet Take 1 tablet (5 mg total) by mouth daily. 15 tablet 0  . aspirin EC 81 MG tablet Take 81 mg by mouth daily.    . Calcium Carbonate-Vitamin D 600-400 MG-UNIT tablet Take 1 tablet by mouth 2 (two) times daily.     No current facility-administered medications on file prior to visit.    BP (!) 171/99 (BP Location: Right Arm, Patient Position: Sitting, Cuff Size: Small)   Pulse 92   Temp 98.5 F (36.9 C) (Oral)   Resp 16   Wt 169 lb (76.7 kg)   LMP 11/15/1957   SpO2 99%   BMI 31.93 kg/m    Objective:   Physical Exam Constitutional:      Appearance: She is well-developed.  Neck:     Thyroid: No thyromegaly.  Cardiovascular:     Rate and Rhythm: Normal rate and regular rhythm.     Heart sounds: Normal heart sounds. No murmur heard.   Pulmonary:     Effort: Pulmonary effort is normal. No respiratory distress.     Breath sounds:  Normal breath sounds. No wheezing.  Musculoskeletal:     Cervical back: Neck supple.  Skin:    General: Skin is warm and dry.  Neurological:     Mental Status: She is alert and oriented to person, place, and time.  Psychiatric:        Behavior: Behavior normal.        Thought Content: Thought content normal.        Judgment: Judgment normal.           Assessment & Plan:  HTN- uncontrolled. Restart amlodipine 5mg . Recheck bp in 2 weeks. Will also plan a cpx at that time.   This visit occurred during the SARS-CoV-2 public health emergency.  Safety protocols were in place, including screening questions prior to the visit, additional usage of staff PPE, and extensive cleaning of exam room while observing appropriate contact time as indicated for disinfecting solutions.

## 2021-02-10 NOTE — Patient Instructions (Signed)
You should be contacted about scheduling your mammogram. Please get a covid-19 booster.  Restart amlodipine (blood pressure medication).

## 2021-02-13 ENCOUNTER — Ambulatory Visit (HOSPITAL_BASED_OUTPATIENT_CLINIC_OR_DEPARTMENT_OTHER)
Admission: RE | Admit: 2021-02-13 | Discharge: 2021-02-13 | Disposition: A | Discharge status: Home / Self Care | Payer: PPO | Source: Ambulatory Visit | Attending: Family | Admitting: Family

## 2021-02-13 DIAGNOSIS — Z1231 Encounter for screening mammogram for malignant neoplasm of breast: Secondary | ICD-10-CM | POA: Diagnosis present

## 2021-02-13 DIAGNOSIS — Z0000 Encounter for general adult medical examination without abnormal findings: Secondary | ICD-10-CM | POA: Diagnosis not present

## 2021-04-10 IMAGING — MG MM DIGITAL SCREENING BILAT W/ TOMO AND CAD
8 series · 8 of 24 positions shown · non-contrast
Comparison: Previous exam(s).

CLINICAL DATA: Screening.

EXAM:
DIGITAL SCREENING BILATERAL MAMMOGRAM WITH TOMOSYNTHESIS AND CAD
TECHNIQUE: Bilateral screening digital craniocaudal and mediolateral oblique
mammograms were obtained. Bilateral screening digital breast
tomosynthesis was performed. The images were evaluated with
computer-aided detection.

[L CC synth-2D]
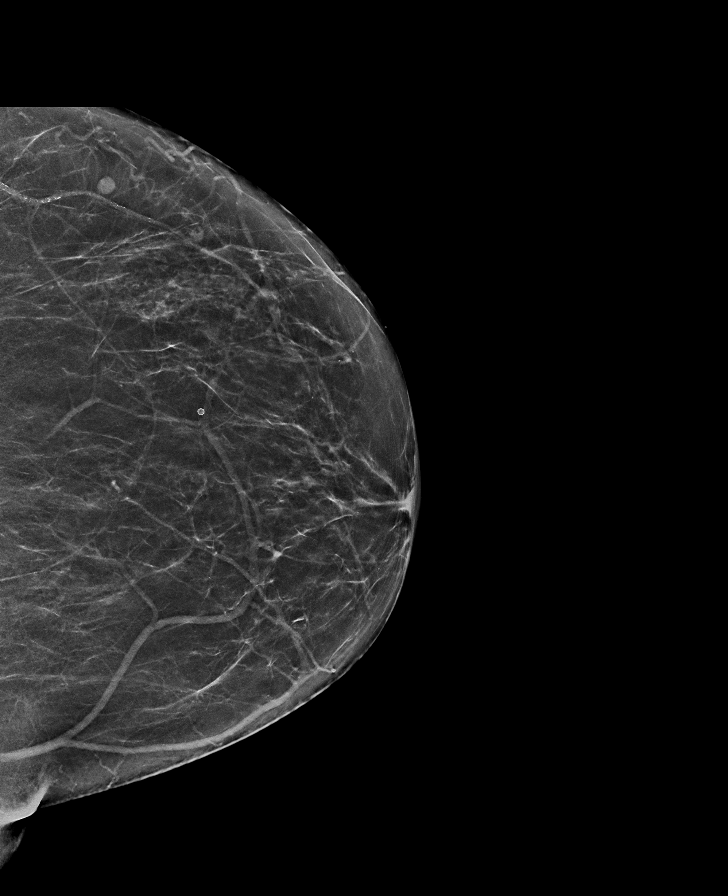

[L MLO synth-2D]
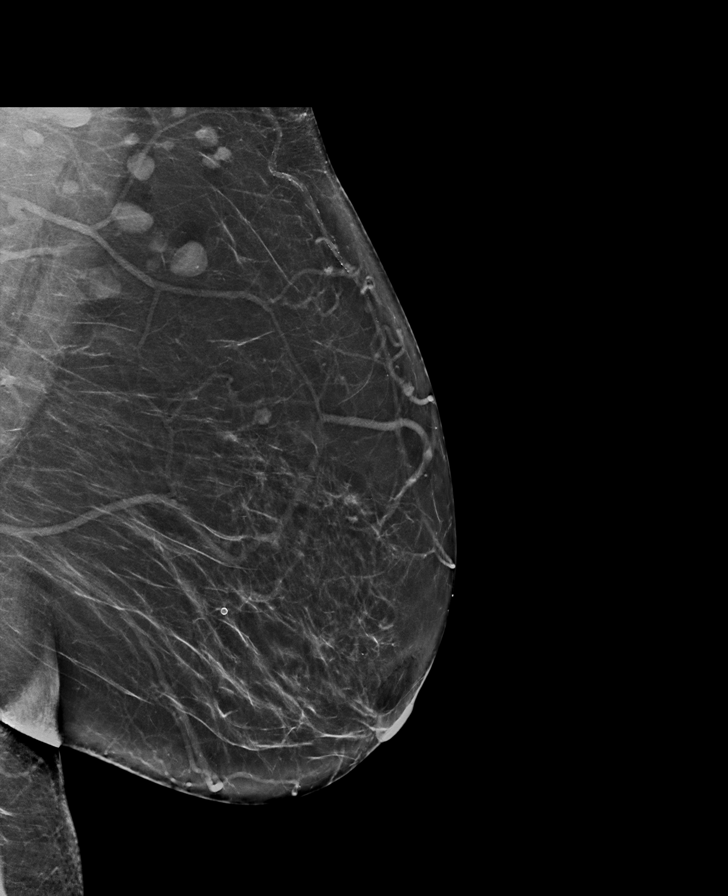

[R CC synth-2D]
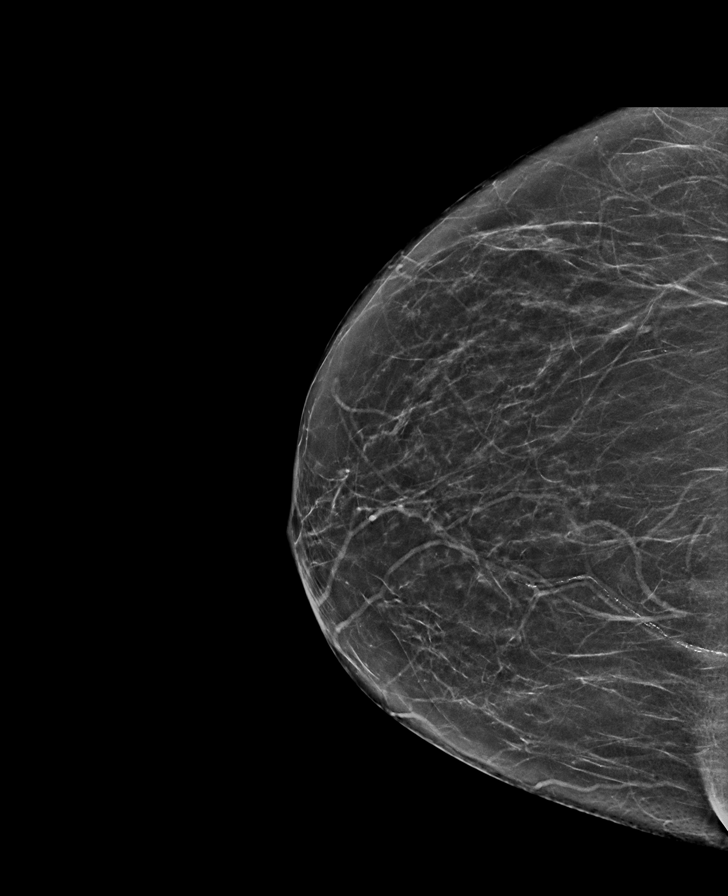

[R MLO synth-2D]
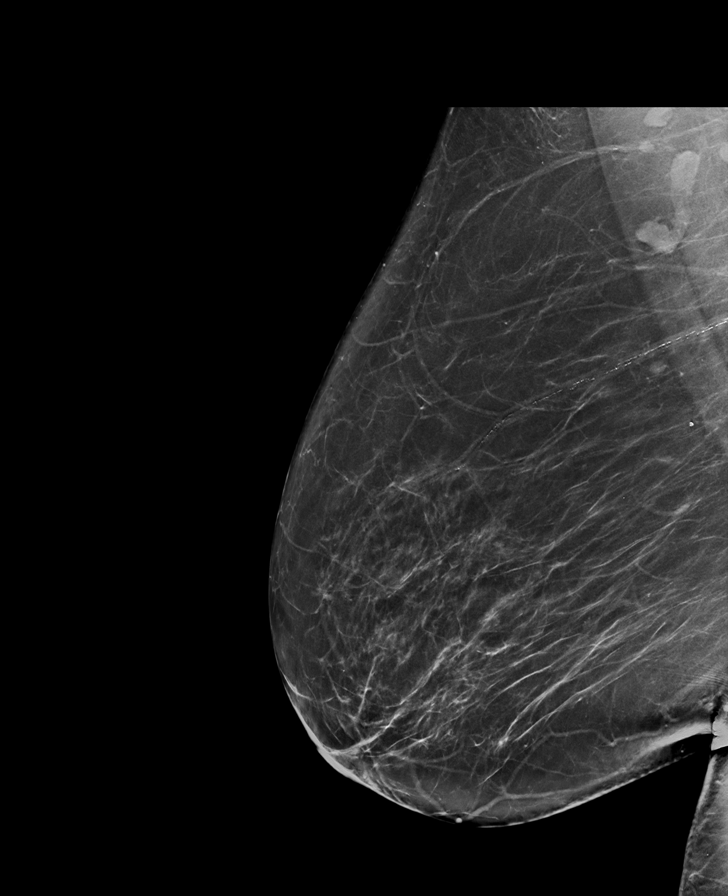

[R MLO tomo · tomo slice 39/77.0]
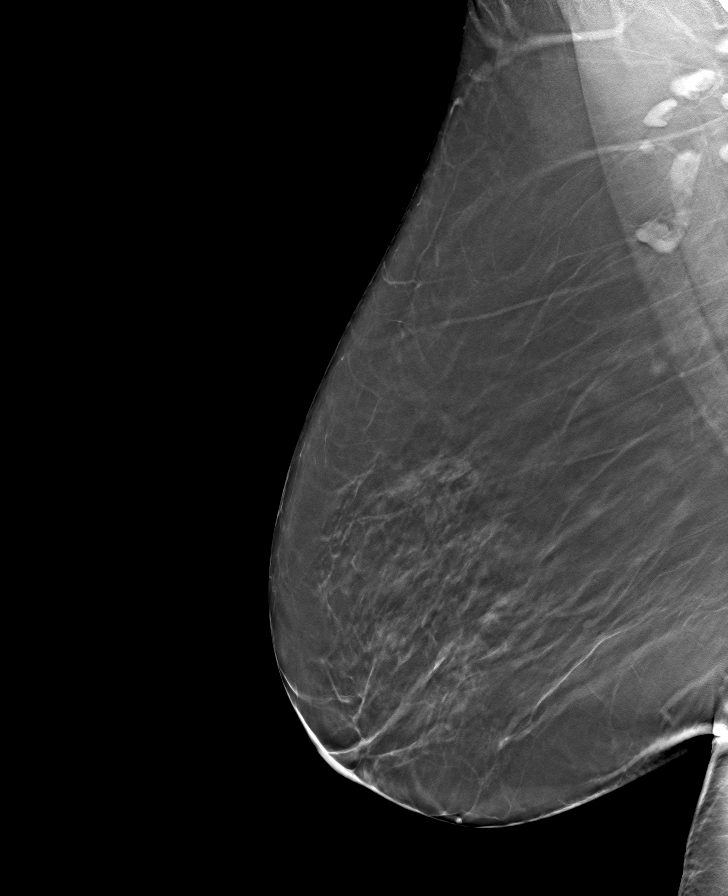

[L MLO tomo · tomo slice 39/76.0]
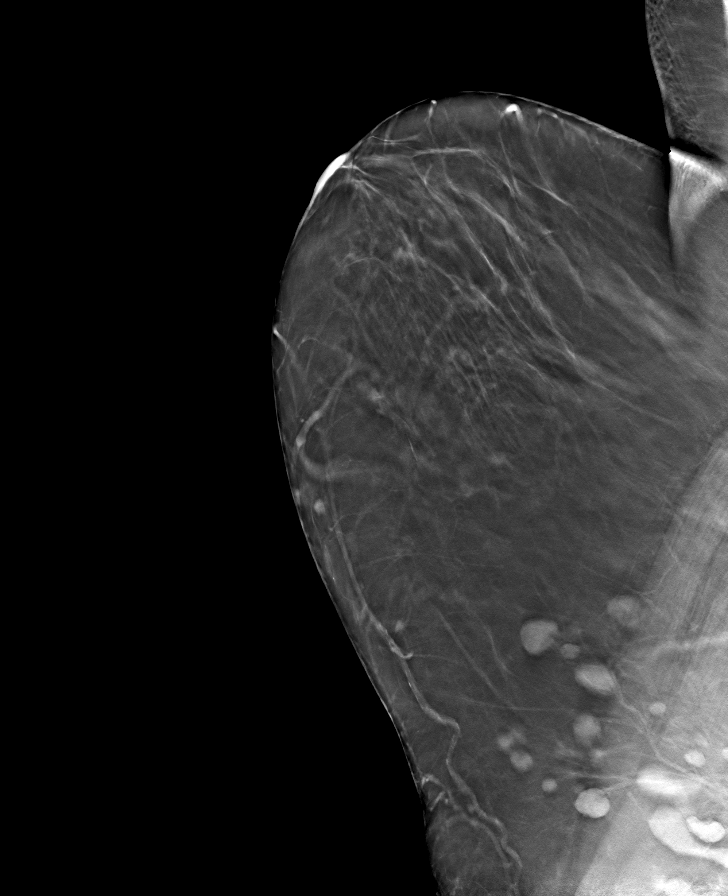

[L CC tomo · tomo slice 30/59.0]
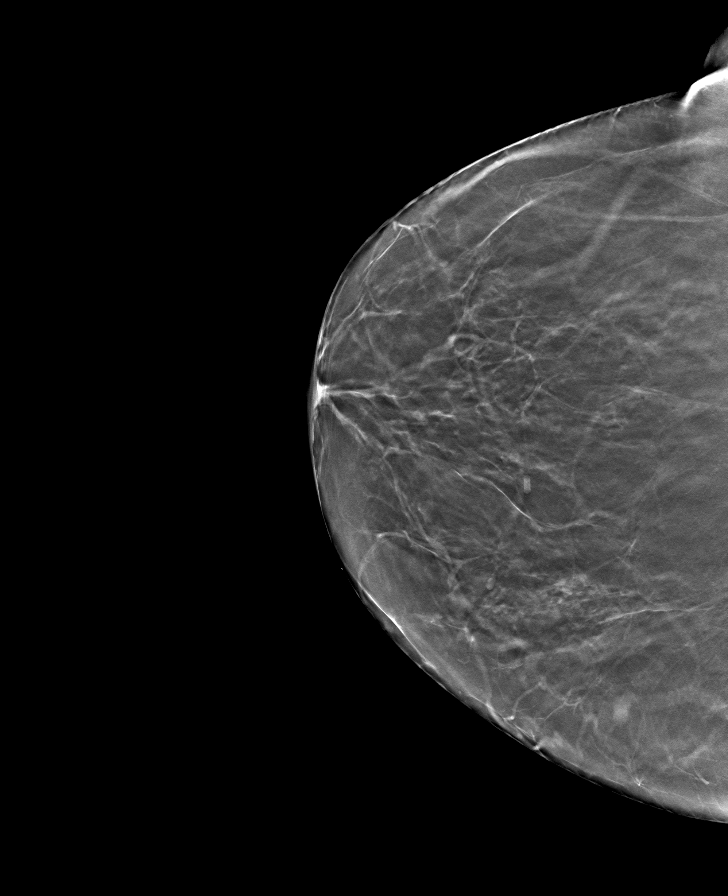

[R CC tomo · tomo slice 31/60.0]
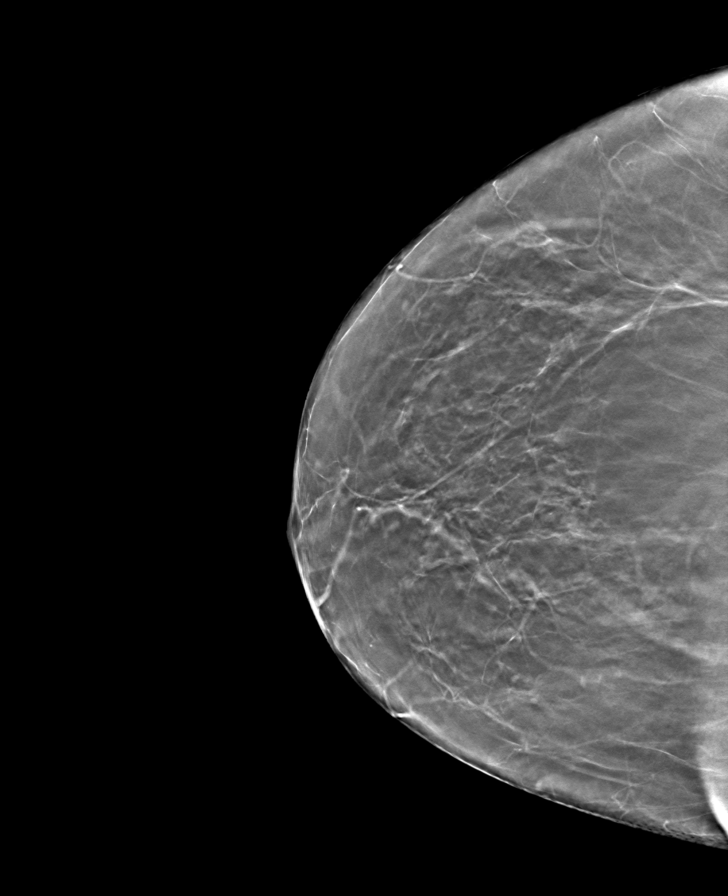

[8 of 24 positions shown; findings below may reference images not displayed]

ACR Breast Density Category b: There are scattered areas of
fibroglandular density.
FINDINGS: There are no findings suspicious for malignancy. The images were
evaluated with computer-aided detection.
IMPRESSION: No mammographic evidence of malignancy. A result letter of this
screening mammogram will be mailed directly to the patient.

RECOMMENDATION:
Screening mammogram in one year. (Code:WJ-[id number])

BI-RADS CATEGORY  1: Negative.

## 2021-05-11 ENCOUNTER — Other Ambulatory Visit: Payer: Self-pay | Admitting: Family

## 2021-08-24 ENCOUNTER — Telehealth: Payer: Self-pay | Admitting: Family

## 2021-08-24 NOTE — Telephone Encounter (Signed)
I attempted to leave message for patient to call back and schedule Medicare Annual Wellness Visit (AWV) in office. No answer.  If not able to come in office, please offer to do virtually or by telephone.  Left office number and my jabber [phone].  Last AWV:07/31/2019  Please schedule at anytime with Nurse Health Advisor.

## 2021-11-05 ENCOUNTER — Other Ambulatory Visit: Payer: Self-pay | Admitting: Family

## 2022-05-07 ENCOUNTER — Other Ambulatory Visit: Payer: Self-pay | Admitting: Family

## 2022-05-14 ENCOUNTER — Ambulatory Visit (INDEPENDENT_AMBULATORY_CARE_PROVIDER_SITE_OTHER): Payer: PPO | Admitting: Family

## 2022-05-14 VITALS — BP 181/91 | HR 92 | Temp 98.6°F | Resp 16 | Wt 167.0 lb

## 2022-05-14 DIAGNOSIS — Z8673 Personal history of transient ischemic attack (TIA), and cerebral infarction without residual deficits: Secondary | ICD-10-CM

## 2022-05-14 DIAGNOSIS — L819 Disorder of pigmentation, unspecified: Secondary | ICD-10-CM | POA: Diagnosis not present

## 2022-05-14 DIAGNOSIS — I10 Essential (primary) hypertension: Secondary | ICD-10-CM | POA: Diagnosis not present

## 2022-05-14 DIAGNOSIS — G40909 Epilepsy, unspecified, not intractable, without status epilepticus: Secondary | ICD-10-CM | POA: Diagnosis not present

## 2022-05-14 MED ORDER — AMLODIPINE BESYLATE 10 MG PO TABS: 10.0000 mg | ORAL_TABLET | Freq: Every day | ORAL | 1 refills | Status: DC

## 2022-05-14 MED ORDER — ATORVASTATIN CALCIUM 10 MG PO TABS: 10.0000 mg | ORAL_TABLET | Freq: Every day | ORAL | 1 refills | Status: DC

## 2022-05-14 NOTE — Assessment & Plan Note (Addendum)
Uncontrolled. Increase amlodipine from 5mg  to 10mg  once daily.

## 2022-05-14 NOTE — Progress Notes (Signed)
Subjective:   By signing my name below, I, Carylon Perches, attest that this documentation has been prepared under the direction and in the presence of [place], NP 05/14/2022.     Patient ID: Latasha [name], female    DOB: 20-Feb-1961, 61 y.o.   MRN: [id number]  Chief Complaint  Patient presents with   Skin Problem    Patient reports skin getting darker on left side of face w/o pain or injury    HPI Patient is in today for an office visit.  Skin discoloration: She presents today with concerns for darkening skin, worse on her left side. She believes the onset of her symptom correlates with restarting her 5 mg amlodipine at her last visit on 02/10/2021. Blood pressure: Her blood pressure is elevated in clinic today (initially 181/91 and repeat check was 180/100). She denies missing any doses of her antihypertensives.  BP Readings from Last 3 Encounters:  05/14/22 (!) 181/91  02/10/21 (!) 171/99  06/10/20 (!) 165/90   Pulse Readings from Last 3 Encounters:  05/14/22 92  02/10/21 92  06/10/20 89   Medications: She also takes 81 mg aspirin daily.    Health Maintenance Due  Topic Date Due   HIV Screening  Never done   Hepatitis C Screening  Never done   TETANUS/TDAP  Never done   COLONOSCOPY (Pts 45-27yrs Insurance coverage will need to be confirmed)  Never done   Zoster Vaccines- Shingrix (1 of 2) Never done   COVID-19 Vaccine (3 - Pfizer series) 04/29/2020   PAP SMEAR-Modifier  07/24/2021    Past Medical History:  Diagnosis Date   History of kidney stones    Seizures ([place])    from childhood   Stroke ([place])    61 years old.    Past Surgical History:  Procedure Laterality Date   APPENDECTOMY     61 years old    Family History  Problem Relation Age of Onset   Arthritis Mother    Hyperlipidemia Mother    Hypertension Mother    Diabetes Mother    Stroke Mother    Arthritis Father    Hypertension Father    Arthritis Sister    Hyperlipidemia Sister      Social History   Socioeconomic History   Marital status: Single    Spouse name: Not on file   Number of children: 3   Years of education: Not on file   Highest education level: Not on file  Occupational History   Not on file  Tobacco Use   Smoking status: Never   Smokeless tobacco: Never  Substance and Sexual Activity   Alcohol use: No   Drug use: No   Sexual activity: Yes  Other Topics Concern   Not on file  Social History Narrative   Regular exercise:  No   Caffeine use: 1 soda daily   3 children   Lives with mother            Social Determinants of Health   Financial Resource Strain: Not on file  Food Insecurity: Not on file  Transportation Needs: Not on file  Physical Activity: Not on file  Stress: Not on file  Social Connections: Not on file  Intimate Partner Violence: Not on file    Outpatient Medications Prior to Visit  Medication Sig Dispense Refill   aspirin EC 81 MG tablet Take 81 mg by mouth daily.     Calcium Carbonate-Vitamin D 600-400 MG-UNIT tablet Take 1 tablet  by mouth 2 (two) times daily.     amLODipine (NORVASC) 5 MG tablet TAKE 1 TABLET (5 MG TOTAL) BY MOUTH DAILY. 90 tablet 1   No facility-administered medications prior to visit.    Allergies  Allergen Reactions   Penicillins Rash    Review of Systems  Eyes:        (+) Periorbital dark circles       Objective:    Physical Exam Constitutional:      Appearance: Normal appearance.  HENT:     Head: Normocephalic and atraumatic.     Right Ear: External ear normal.     Left Ear: External ear normal.  Eyes:     Extraocular Movements: Extraocular movements intact.     Pupils: Pupils are equal, round, and reactive to light.  Skin:    General: Skin is warm and dry.     Comments: Hyperpigmentation of left cheek.  Neurological:     Mental Status: She is alert and oriented to person, place, and time.  Psychiatric:        Mood and Affect: Mood normal.        Behavior:  Behavior normal.        Judgment: Judgment normal.     BP (!) 181/91 (BP Location: Right Arm, Patient Position: Sitting, Cuff Size: Small)   Pulse 92   Temp 98.6 F (37 C) (Oral)   Resp 16   Wt 167 lb (75.8 kg)   LMP 11/15/1957   SpO2 97%   BMI 31.55 kg/m  Wt Readings from Last 3 Encounters:  05/14/22 167 lb (75.8 kg)  02/10/21 169 lb (76.7 kg)  06/10/20 165 lb (74.8 kg)        Assessment & Plan:   Problem List Items Addressed This Visit       Unprioritized   Seizure disorder ([place])    Stable. Mother reports last seizure was 2 years ago.       Hyperpigmentation - Primary    New. Will refer to dermatology for further evaluation.       Relevant Orders   Ambulatory referral to Dermatology   HTN (hypertension)    Uncontrolled. Increase amlodipine from $RemoveBeforeD'5mg'[id number]$  to $R'10mg'om$  once daily.        Relevant Medications   amLODipine (NORVASC) 10 MG tablet   atorvastatin (LIPITOR) 10 MG tablet   History of stroke    Reinforced importance of aspirin daily for secondary stroke prevention as well as addition of atorvastatin $RemoveBeforeD'10mg'PDKrhbzQCOGVkv$  once daily for secondary stroke prevention.    Reviewed medical plan with the patient's mom on the phone after her visit.       Relevant Orders   Comp Met (CMET)   Lipid panel      Meds ordered this encounter  Medications   amLODipine (NORVASC) 10 MG tablet    Sig: Take 1 tablet (10 mg total) by mouth daily.    Dispense:  90 tablet    Refill:  1    Order Specific Question:   Supervising Provider    Answer:   Penni Homans A [[id number]]   atorvastatin (LIPITOR) 10 MG tablet    Sig: Take 1 tablet (10 mg total) by mouth daily.    Dispense:  90 tablet    Refill:  1    Order Specific Question:   Supervising Provider    Answer:   Penni Homans A [[id number]]    I, Nance Pear, NP, personally preformed the services described in this documentation.  All medical record entries made by the scribe were at my direction and in my presence.  I have reviewed  the chart and discharge instructions (if applicable) and agree that the record reflects my personal performance and is accurate and complete. 05/14/2022   I,Amber Collins,acting as a scribe for Nance Pear, NP.,have documented all relevant documentation on the behalf of Nance Pear, NP,as directed by  Nance Pear, NP while in the presence of Nance Pear, NP.   Nance Pear, NP

## 2022-05-14 NOTE — Patient Instructions (Signed)
Please increase amlodipine 10 mg once daily.  Add atorvastatin 10mg  once daily. Take aspirin 81mg  once daily.

## 2022-05-14 NOTE — Assessment & Plan Note (Signed)
New. Will refer to dermatology for further evaluation.

## 2022-05-14 NOTE — Assessment & Plan Note (Signed)
Reinforced importance of aspirin daily for secondary stroke prevention as well as addition of atorvastatin 10mg  once daily for secondary stroke prevention.    Reviewed medical plan with the patient's mom on the phone after her visit.

## 2022-05-14 NOTE — Assessment & Plan Note (Signed)
Stable. Mother reports last seizure was 2 years ago.

## 2022-05-15 LAB — COMPREHENSIVE METABOLIC PANEL
AG Ratio: 1 (calc) (ref 1.0–2.5)
ALT: 9 U/L (ref 6–29)
AST: 16 U/L (ref 10–35)
Albumin: 4.2 g/dL (ref 3.6–5.1)
Alkaline phosphatase (APISO): 82 U/L (ref 37–153)
BUN: 9 mg/dL (ref 7–25)
CO2: 22 mmol/L (ref 20–32)
Calcium: 9.5 mg/dL (ref 8.6–10.4)
Chloride: 105 mmol/L (ref 98–110)
Creat: 0.74 mg/dL (ref 0.50–1.05)
Globulin: 4.2 g/dL (calc) — ABNORMAL HIGH (ref 1.9–3.7)
Glucose, Bld: 87 mg/dL (ref 65–99)
Potassium: 4.1 mmol/L (ref 3.5–5.3)
Sodium: 138 mmol/L (ref 135–146)
Total Bilirubin: 0.8 mg/dL (ref 0.2–1.2)
Total Protein: 8.4 g/dL — ABNORMAL HIGH (ref 6.1–8.1)

## 2022-05-15 LAB — LIPID PANEL
Cholesterol: 173 mg/dL (ref <200)
HDL: 46 mg/dL — ABNORMAL LOW (ref >=50)
LDL Cholesterol (Calc): 112 mg/dL (calc) — ABNORMAL HIGH
Non-HDL Cholesterol (Calc): 127 mg/dL (calc) (ref <130)
Total CHOL/HDL Ratio: 3.8 (calc) (ref <5.0)
Triglycerides: 60 mg/dL (ref <150)

## 2022-05-17 ENCOUNTER — Telehealth: Payer: Self-pay | Admitting: Family

## 2022-05-17 NOTE — Telephone Encounter (Signed)
Opened in error

## 2022-05-31 DIAGNOSIS — L819 Disorder of pigmentation, unspecified: Secondary | ICD-10-CM | POA: Diagnosis not present

## 2022-07-23 ENCOUNTER — Ambulatory Visit: Payer: PPO

## 2022-07-26 ENCOUNTER — Ambulatory Visit (INDEPENDENT_AMBULATORY_CARE_PROVIDER_SITE_OTHER): Payer: PPO | Admitting: *Deleted

## 2022-07-26 DIAGNOSIS — Z0000 Encounter for general adult medical examination without abnormal findings: Secondary | ICD-10-CM

## 2022-07-26 NOTE — Progress Notes (Signed)
Subjective:   Latasha [name] is a 61 y.o. female who presents for Medicare Annual (Subsequent) preventive examination.   I connected with  Latasha [name] on 07/26/22 by a audio enabled telemedicine application and verified that I am speaking with the correct person using two identifiers.  Patient Location: Home  Provider Location: Office/Clinic  I discussed the limitations of evaluation and management by telemedicine. The patient expressed understanding and agreed to proceed.  Review of Systems    Defer to PCP Cardiac Risk Factors include: hypertension     Objective:    There were no vitals filed for this visit. There is no height or weight on file to calculate BMI.     07/26/2022   10:34 AM 07/31/2019    2:08 PM 07/24/2018    1:26 PM 02/25/2017   10:17 AM  Advanced Directives  Does Patient Have a Medical Advance Directive? No;Yes No No Yes  Type of Sales executive Power of Attorney  Does patient want to make changes to medical advance directive? No - Patient declined  Yes (MAU/Ambulatory/Procedural Areas - Information given)   Copy of Healthcare Power of Attorney in Chart? No - copy requested   No - copy requested  Would patient like information on creating a medical advance directive? No - Patient declined No - Patient declined      Current Medications (verified) Outpatient Encounter Medications as of 07/26/2022  Medication Sig   amLODipine (NORVASC) 10 MG tablet Take 1 tablet (10 mg total) by mouth daily.   aspirin EC 81 MG tablet Take 81 mg by mouth daily.   atorvastatin (LIPITOR) 10 MG tablet Take 1 tablet (10 mg total) by mouth daily.   Calcium Carbonate-Vitamin D 600-400 MG-UNIT tablet Take 1 tablet by mouth 2 (two) times daily.   No facility-administered encounter medications on file as of 07/26/2022.    Allergies (verified) Penicillins   History: Past Medical History:  Diagnosis Date   History of kidney stones     Seizures ([facility])    from childhood   Stroke ([facility])    61 years old.   Past Surgical History:  Procedure Laterality Date   APPENDECTOMY     60 years old   Family History  Problem Relation Age of Onset   Arthritis Mother    Hyperlipidemia Mother    Hypertension Mother    Diabetes Mother    Stroke Mother    Arthritis Father    Hypertension Father    Arthritis Sister    Hyperlipidemia Sister    Social History   Socioeconomic History   Marital status: Single    Spouse name: Not on file   Number of children: 3   Years of education: Not on file   Highest education level: Not on file  Occupational History   Not on file  Tobacco Use   Smoking status: Never   Smokeless tobacco: Never  Substance and Sexual Activity   Alcohol use: No   Drug use: No   Sexual activity: Yes  Other Topics Concern   Not on file  Social History Narrative   Regular exercise:  No   Caffeine use: 1 soda daily   3 children   Lives with mother            Social Determinants of Health   Financial Resource Strain: Low Risk  (07/26/2022)   Overall Financial Resource Strain (CARDIA)    Difficulty of Paying Living Expenses: Not very  hard  Food Insecurity: No Food Insecurity (07/26/2022)   Hunger Vital Sign    Worried About Running Out of Food in the Last Year: Never true    Ran Out of Food in the Last Year: Never true  Transportation Needs: No Transportation Needs (07/26/2022)   PRAPARE - Administrator, Civil Service (Medical): No    Lack of Transportation (Non-Medical): No  Physical Activity: Inactive (07/26/2022)   Exercise Vital Sign    Days of Exercise per Week: 0 days    Minutes of Exercise per Session: 0 min  Stress: No Stress Concern Present (07/26/2022)   [employer] of Occupational Health - Occupational Stress Questionnaire    Feeling of Stress : Not at all  Social Connections: Moderately Integrated (07/26/2022)   Social Connection and Isolation Panel [NHANES]     Frequency of Communication with Friends and Family: More than three times a week    Frequency of Social Gatherings with Friends and Family: Twice a week    Attends Religious Services: More than 4 times per year    Active Member of [employer] or Organizations: Yes    Attends Engineer, structural: More than 4 times per year    Marital Status: Never married    Tobacco Counseling Counseling given: Not Answered   Clinical Intake:  Pre-visit preparation completed: No  Pain : No/denies pain     Diabetes: No  How often do you need to have someone help you when you read instructions, pamphlets, or other written materials from your doctor or pharmacy?: 1 - Never  Diabetic? No  Interpreter Needed?: No  Information entered by :: Donne Anon, CMA   Activities of Daily Living    07/26/2022   10:29 AM  In your present state of health, do you have any difficulty performing the following activities:  Hearing? 0  Vision? 0  Difficulty concentrating or making decisions? 0  Walking or climbing stairs? 0  Dressing or bathing? 0  Doing errands, shopping? 0  Preparing Food and eating ? N  Using the Toilet? N  In the past six months, have you accidently leaked urine? N  Do you have problems with loss of bowel control? N  Managing your Medications? N  Managing your Finances? N  Housekeeping or managing your Housekeeping? N    Patient Care Team: Sandford Craze, NP as PCP - General (Internal Medicine) Van Clines, MD as Consulting Physician (Neurology)  Indicate any recent Medical Services you may have received from other than [facility] providers in the past year (date may be approximate).     Assessment:   This is a routine wellness examination for Latasha [name].  Hearing/Vision screen No results found.  Dietary issues and exercise activities discussed: Current Exercise Habits: Home exercise routine, Type of exercise: walking, Time (Minutes): 40, Frequency (Times/Week): 7,  Weekly Exercise (Minutes/Week): 280, Intensity: Mild, Exercise limited by: None identified   Goals Addressed   None    Depression Screen    07/26/2022   10:31 AM 02/10/2021   10:42 AM 07/31/2019    2:08 PM 07/24/2018    1:26 PM 02/25/2017    9:37 AM 11/26/2016    6:35 PM  PHQ 2/9 Scores  PHQ - 2 Score 0 0 0 0 0 0  PHQ- 9 Score      2    Fall Risk    07/26/2022   10:26 AM 07/31/2019    2:08 PM 07/24/2018    1:26 PM 02/25/2017  9:37 AM 02/02/2017    2:06 PM  Fall Risk   Falls in the past year? 0 0 No No No  Number falls in past yr: 0      Injury with Fall? 0      Risk for fall due to : No Fall Risks      Follow up Falls evaluation completed        FALL RISK PREVENTION PERTAINING TO THE HOME:  Any stairs in or around the home? Yes  If so, are there any without handrails? No  Home free of loose throw rugs in walkways, pet beds, electrical cords, etc? Yes  Adequate lighting in your home to reduce risk of falls? Yes   ASSISTIVE DEVICES UTILIZED TO PREVENT FALLS:  Life alert? No  Use of a cane, walker or w/c? No  Grab bars in the bathroom? No  Shower chair or bench in shower? No  Elevated toilet seat or a handicapped toilet? No     Cognitive Function:    07/24/2018    1:26 PM 02/25/2017    9:39 AM  MMSE - Mini Mental State Exam  Orientation to time 5 5  Orientation to Place 5 5  Registration 3 3  Attention/ Calculation 4 4  Recall 2 3  Language- name 2 objects 2 2  Language- repeat 1 1  Language- follow 3 step command 3 3  Language- read & follow direction 1 1  Write a sentence 1 0  Copy design 1 0  Total score 28 27        07/26/2022   10:35 AM  6CIT Screen  What Year? 0 points  What month? 0 points  What time? 0 points  Count back from 20 0 points  Months in reverse 0 points  Repeat phrase 8 points  Total Score 8 points    Immunizations Immunization History  Administered Date(s) Administered   PFIZER(Purple Top)SARS-COV-2 Vaccination 02/07/2020,  03/04/2020    TDAP status: Due, Education has been provided regarding the importance of this vaccine. Advised may receive this vaccine at local pharmacy or Health Dept. Aware to provide a copy of the vaccination record if obtained from local pharmacy or Health Dept. Verbalized acceptance and understanding.  Flu Vaccine status: Due, Education has been provided regarding the importance of this vaccine. Advised may receive this vaccine at local pharmacy or Health Dept. Aware to provide a copy of the vaccination record if obtained from local pharmacy or Health Dept. Verbalized acceptance and understanding.  Pneumococcal vaccine status: Due, Education has been provided regarding the importance of this vaccine. Advised may receive this vaccine at local pharmacy or Health Dept. Aware to provide a copy of the vaccination record if obtained from local pharmacy or Health Dept. Verbalized acceptance and understanding.  Covid-19 vaccine status: Information provided on how to obtain vaccines.   Qualifies for Shingles Vaccine? Yes   Zostavax completed No   Shingrix Completed?: No.    Education has been provided regarding the importance of this vaccine. Patient has been advised to call insurance company to determine out of pocket expense if they have not yet received this vaccine. Advised may also receive vaccine at local pharmacy or Health Dept. Verbalized acceptance and understanding.  Screening Tests Health Maintenance  Topic Date Due   HIV Screening  Never done   Hepatitis C Screening  Never done   TETANUS/TDAP  Never done   COLONOSCOPY (Pts 45-54yrs Insurance coverage will need to be confirmed)  Never done  Zoster Vaccines- Shingrix (1 of 2) Never done   COVID-19 Vaccine (3 - Pfizer series) 04/29/2020   PAP SMEAR-Modifier  07/24/2021   INFLUENZA VACCINE  Never done   MAMMOGRAM  02/14/2023   HPV VACCINES  Aged Out    Health Maintenance  Health Maintenance Due  Topic Date Due   HIV Screening   Never done   Hepatitis C Screening  Never done   TETANUS/TDAP  Never done   COLONOSCOPY (Pts 45-70yrs Insurance coverage will need to be confirmed)  Never done   Zoster Vaccines- Shingrix (1 of 2) Never done   COVID-19 Vaccine (3 - Pfizer series) 04/29/2020   PAP SMEAR-Modifier  07/24/2021   INFLUENZA VACCINE  Never done    Colorectal cancer screening: No longer required. Per pt preference   Mammogram status: No longer required due to pt preference.  Bone Density Screening: pt prefers to not have done  Lung Cancer Screening: (Low Dose CT Chest recommended if Age 37-80 years, 30 pack-year currently smoking OR have quit w/in 15years.) does not qualify.   Lung Cancer Screening Referral: Na  Additional Screening:  Hepatitis C Screening: does qualify; Completed N/a  Vision Screening: Recommended annual ophthalmology exams for early detection of glaucoma and other disorders of the eye. Is the patient up to date with their annual eye exam?  No  Who is the provider or what is the name of the office in which the patient attends annual eye exams? N/a If pt is not established with a provider, would they like to be referred to a provider to establish care? No .   Dental Screening: Recommended annual dental exams for proper oral hygiene  Community Resource Referral / Chronic Care Management: CRR required this visit?  No   CCM required this visit?  No      Plan:     I have personally reviewed and noted the following in the patient's chart:   Medical and social history Use of alcohol, tobacco or illicit drugs  Current medications and supplements including opioid prescriptions. Patient is not currently taking opioid prescriptions. Functional ability and status Nutritional status Physical activity Advanced directives List of other physicians Hospitalizations, surgeries, and ER visits in previous 12 months Vitals Screenings to include cognitive, depression, and falls Referrals and  appointments  In addition, I have reviewed and discussed with patient certain preventive protocols, quality metrics, and best practice recommendations. A written personalized care plan for preventive services as well as general preventive health recommendations were provided to patient.   Due to this being a telephonic visit, the after visit summary with patients personalized plan was offered to patient via mail or my-chart. Per request, patient was mailed a copy of AVS.  Donne Anon, CMA   07/26/2022   Nurse Notes: None

## 2022-07-26 NOTE — Patient Instructions (Signed)
[name] , Thank you for taking time to come for your Medicare Wellness Visit. I appreciate your ongoing commitment to your health goals. Please review the following plan we discussed and let me know if I can assist you in the future.   These are the goals we discussed:  Goals      Healthy Lifestyle     Continue to eat heart healthy diet (full of fruits, vegetables, whole grains, lean protein, water--limit salt, fat, and sugar intake) and increase physical activity as tolerated.        This is a list of the screening recommended for you and due dates:  Health Maintenance  Topic Date Due   HIV Screening  Never done   Hepatitis C Screening: USPSTF Recommendation to screen - Ages 21-79 yo.  Never done   Tetanus Vaccine  Never done   Colon Cancer Screening  Never done   Zoster (Shingles) Vaccine (1 of 2) Never done   COVID-19 Vaccine (3 - Pfizer series) 04/29/2020   Pap Smear  07/24/2021   Flu Shot  Never done   Mammogram  02/14/2023   HPV Vaccine  Aged Out        Next appointment: Follow up in one year for your annual wellness visit.   Preventive Care 40-64 Years, Female Preventive care refers to lifestyle choices and visits with your health care provider that can promote health and wellness. What does preventive care include? A yearly physical exam. This is also called an annual well check. Dental exams once or twice a year. Routine eye exams. Ask your health care provider how often you should have your eyes checked. Personal lifestyle choices, including: Daily care of your teeth and gums. Regular physical activity. Eating a healthy diet. Avoiding tobacco and drug use. Limiting alcohol use. Practicing safe sex. Taking low-dose aspirin daily starting at age 96. Taking vitamin and mineral supplements as recommended by your health care provider. What happens during an annual well check? The services and screenings done by your health care provider during your annual  well check will depend on your age, overall health, lifestyle risk factors, and family history of disease. Counseling  Your health care provider may ask you questions about your: Alcohol use. Tobacco use. Drug use. Emotional well-being. Home and relationship well-being. Sexual activity. Eating habits. Work and work Statistician. Method of birth control. Menstrual cycle. Pregnancy history. Screening  You may have the following tests or measurements: Height, weight, and BMI. Blood pressure. Lipid and cholesterol levels. These may be checked every 5 years, or more frequently if you are over 18 years old. Skin check. Lung cancer screening. You may have this screening every year starting at age 44 if you have a 30-pack-year history of smoking and currently smoke or have quit within the past 15 years. Fecal occult blood test (FOBT) of the stool. You may have this test every year starting at age 4. Flexible sigmoidoscopy or colonoscopy. You may have a sigmoidoscopy every 5 years or a colonoscopy every 10 years starting at age 64. Hepatitis C blood test. Hepatitis B blood test. Sexually transmitted disease (STD) testing. Diabetes screening. This is done by checking your blood sugar (glucose) after you have not eaten for a while (fasting). You may have this done every 1-3 years. Mammogram. This may be done every 1-2 years. Talk to your health care provider about when you should start having regular mammograms. This may depend on whether you have a family history of breast cancer.  BRCA-related cancer screening. This may be done if you have a family history of breast, ovarian, tubal, or peritoneal cancers. Pelvic exam and Pap test. This may be done every 3 years starting at age 11. Starting at age 77, this may be done every 5 years if you have a Pap test in combination with an HPV test. Bone density scan. This is done to screen for osteoporosis. You may have this scan if you are at high risk for  osteoporosis. Discuss your test results, treatment options, and if necessary, the need for more tests with your health care provider. Vaccines  Your health care provider may recommend certain vaccines, such as: Influenza vaccine. This is recommended every year. Tetanus, diphtheria, and acellular pertussis (Tdap, Td) vaccine. You may need a Td booster every 10 years. Zoster vaccine. You may need this after age 96. Pneumococcal 13-valent conjugate (PCV13) vaccine. You may need this if you have certain conditions and were not previously vaccinated. Pneumococcal polysaccharide (PPSV23) vaccine. You may need one or two doses if you smoke cigarettes or if you have certain conditions. Talk to your health care provider about which screenings and vaccines you need and how often you need them. This information is not intended to replace advice given to you by your health care provider. Make sure you discuss any questions you have with your health care provider. Document Released: 11/28/2015 Document Revised: 07/21/2016 Document Reviewed: 09/02/2015 Elsevier Interactive Patient Education  2017 [place] Prevention in the Home Falls can cause injuries. They can happen to people of all ages. There are many things you can do to make your home safe and to help prevent falls. What can I do on the outside of my home? Regularly fix the edges of walkways and driveways and fix any cracks. Remove anything that might make you trip as you walk through a door, such as a raised step or threshold. Trim any bushes or trees on the path to your home. Use bright outdoor lighting. Clear any walking paths of anything that might make someone trip, such as rocks or tools. Regularly check to see if handrails are loose or broken. Make sure that both sides of any steps have handrails. Any raised decks and porches should have guardrails on the edges. Have any leaves, snow, or ice cleared regularly. Use sand or  salt on walking paths during winter. Clean up any spills in your garage right away. This includes oil or grease spills. What can I do in the bathroom? Use night lights. Install grab bars by the toilet and in the tub and shower. Do not use towel bars as grab bars. Use non-skid mats or decals in the tub or shower. If you need to sit down in the shower, use a plastic, non-slip stool. Keep the floor dry. Clean up any water that spills on the floor as soon as it happens. Remove soap buildup in the tub or shower regularly. Attach bath mats securely with double-sided non-slip rug tape. Do not have throw rugs and other things on the floor that can make you trip. What can I do in the bedroom? Use night lights. Make sure that you have a light by your bed that is easy to reach. Do not use any sheets or blankets that are too big for your bed. They should not hang down onto the floor. Have a firm chair that has side arms. You can use this for support while you get dressed. Do not have  throw rugs and other things on the floor that can make you trip. What can I do in the kitchen? Clean up any spills right away. Avoid walking on wet floors. Keep items that you use a lot in easy-to-reach places. If you need to reach something above you, use a strong step stool that has a grab bar. Keep electrical cords out of the way. Do not use floor polish or wax that makes floors slippery. If you must use wax, use non-skid floor wax. Do not have throw rugs and other things on the floor that can make you trip. What can I do with my stairs? Do not leave any items on the stairs. Make sure that there are handrails on both sides of the stairs and use them. Fix handrails that are broken or loose. Make sure that handrails are as long as the stairways. Check any carpeting to make sure that it is firmly attached to the stairs. Fix any carpet that is loose or worn. Avoid having throw rugs at the top or bottom of the stairs. If  you do have throw rugs, attach them to the floor with carpet tape. Make sure that you have a light switch at the top of the stairs and the bottom of the stairs. If you do not have them, ask someone to add them for you. What else can I do to help prevent falls? Wear shoes that: Do not have high heels. Have rubber bottoms. Are comfortable and fit you well. Are closed at the toe. Do not wear sandals. If you use a stepladder: Make sure that it is fully opened. Do not climb a closed stepladder. Make sure that both sides of the stepladder are locked into place. Ask someone to hold it for you, if possible. Clearly mark and make sure that you can see: Any grab bars or handrails. First and last steps. Where the edge of each step is. Use tools that help you move around (mobility aids) if they are needed. These include: Canes. Walkers. Scooters. Crutches. Turn on the lights when you go into a dark area. Replace any light bulbs as soon as they burn out. Set up your furniture so you have a clear path. Avoid moving your furniture around. If any of your floors are uneven, fix them. If there are any pets around you, be aware of where they are. Review your medicines with your doctor. Some medicines can make you feel dizzy. This can increase your chance of falling. Ask your doctor what other things that you can do to help prevent falls. This information is not intended to replace advice given to you by your health care provider. Make sure you discuss any questions you have with your health care provider. Document Released: 08/28/2009 Document Revised: 04/08/2016 Document Reviewed: 12/06/2014 Elsevier Interactive Patient Education  2017 [employer].

## 2022-10-29 ENCOUNTER — Other Ambulatory Visit: Payer: Self-pay | Admitting: Family

## 2022-11-19 DIAGNOSIS — L819 Disorder of pigmentation, unspecified: Secondary | ICD-10-CM | POA: Diagnosis not present

## 2022-11-21 ENCOUNTER — Other Ambulatory Visit: Payer: Self-pay | Admitting: Family

## 2023-05-21 ENCOUNTER — Other Ambulatory Visit: Payer: Self-pay | Admitting: Family

## 2023-05-21 NOTE — Telephone Encounter (Signed)
I sent a 30 day supply of her medicines, but I have not seen her in >1 year. Please schedule follow up visit.

## 2023-05-24 NOTE — Telephone Encounter (Signed)
Lvm2 sched  

## 2023-06-07 ENCOUNTER — Telehealth: Payer: Self-pay

## 2023-06-20 ENCOUNTER — Other Ambulatory Visit: Payer: Self-pay | Admitting: Family

## 2023-08-01 ENCOUNTER — Encounter: Payer: Self-pay | Admitting: Family

## 2023-09-13 ENCOUNTER — Encounter: Payer: Self-pay | Admitting: Family

## 2023-09-13 ENCOUNTER — Ambulatory Visit (INDEPENDENT_AMBULATORY_CARE_PROVIDER_SITE_OTHER): Payer: PPO | Admitting: Family

## 2023-09-13 ENCOUNTER — Other Ambulatory Visit (HOSPITAL_COMMUNITY)
Admission: RE | Admit: 2023-09-13 | Discharge: 2023-09-13 | Disposition: A | Discharge status: Home / Self Care | Payer: PPO | Source: Ambulatory Visit | Attending: Family | Admitting: Family

## 2023-09-13 VITALS — BP 125/75 | HR 95 | Temp 98.6°F | Resp 16 | Ht 61.0 in | Wt 163.0 lb

## 2023-09-13 DIAGNOSIS — Z01419 Encounter for gynecological examination (general) (routine) without abnormal findings: Secondary | ICD-10-CM | POA: Insufficient documentation

## 2023-09-13 DIAGNOSIS — Z1231 Encounter for screening mammogram for malignant neoplasm of breast: Secondary | ICD-10-CM

## 2023-09-13 DIAGNOSIS — Z1151 Encounter for screening for human papillomavirus (HPV): Secondary | ICD-10-CM | POA: Insufficient documentation

## 2023-09-13 DIAGNOSIS — Z8673 Personal history of transient ischemic attack (TIA), and cerebral infarction without residual deficits: Secondary | ICD-10-CM | POA: Diagnosis not present

## 2023-09-13 DIAGNOSIS — Z0000 Encounter for general adult medical examination without abnormal findings: Secondary | ICD-10-CM

## 2023-09-13 DIAGNOSIS — I10 Essential (primary) hypertension: Secondary | ICD-10-CM | POA: Diagnosis not present

## 2023-09-13 DIAGNOSIS — E785 Hyperlipidemia, unspecified: Secondary | ICD-10-CM | POA: Diagnosis not present

## 2023-09-13 DIAGNOSIS — Z1211 Encounter for screening for malignant neoplasm of colon: Secondary | ICD-10-CM

## 2023-09-13 DIAGNOSIS — R779 Abnormality of plasma protein, unspecified: Secondary | ICD-10-CM | POA: Diagnosis not present

## 2023-09-13 NOTE — Addendum Note (Signed)
Addended by: Sandford Craze on: 09/13/2023 02:59 PM   Modules accepted: Orders, Level of Service

## 2023-09-13 NOTE — Assessment & Plan Note (Addendum)
Continue statin/aspirin, BP control.

## 2023-09-13 NOTE — Patient Instructions (Signed)
VISIT SUMMARY:  Today, you came in for a routine check-up. YWe discussed various health screenings and vaccinations that are due, and you expressed concerns about undergoing a colonoscopy due to a past experience with anesthesia.  YOUR PLAN:   -HIV AND HEPATITIS C SCREENING: You have never been screened for HIV or Hepatitis C.   -COLON CANCER SCREENING: You have not had a recent colonoscopy due to a past adverse reaction to anesthesia. We will send a Cologuard stool test kit to your home for colon cancer screening.  -CERVICAL CANCER SCREENING: You are due for a Pap smear, which is a test to screen for cervical cancer. We will perform the Pap smear today.  -VACCINATIONS: You are due for tetanus and shingles vaccines. Since your insurance does not cover in-office administration, we recommend obtaining the shingles vaccine at a pharmacy.  -DENTAL HEALTH: You have not had a recent dental visit. We recommend scheduling a dental appointment.  -BREAST CANCER SCREENING: You are due for a mammogram, which is a screening test for breast cancer. We will schedule your mammogram at the downstairs facility.  -GENERAL HEALTH: You report regular exercise and a good diet, and you do not smoke or drink alcohol. Continue these healthy lifestyle habits.  INSTRUCTIONS:  Please follow up with the recommended screenings and vaccinations. Schedule your mammogram at the downstairs facility and make an appointment with your dentist. Obtain the shingles vaccine at a pharmacy. We will recheck your blood pressure during this visit.

## 2023-09-13 NOTE — Addendum Note (Signed)
Addended by: Wilford Corner on: 09/13/2023 03:23 PM   Modules accepted: Orders

## 2023-09-13 NOTE — Progress Notes (Signed)
Subjective:     Patient ID: Latasha [name], female    DOB: 1961-10-08, 62 y.o.   MRN: [id number]  Chief Complaint  Patient presents with   Annual Exam    HPI  Discussed the use of AI scribe software for clinical note transcription with the patient, who gave verbal consent to proceed.  History of Present Illness   Latasha [name], a patient with a history CVA and epilepsy, presents for a routine check-up. She is accompanied by her The patient's blood pressure was initially elevated at 148/83. The patient has been seizure-free for many years.. The patient walks daily for exercise and reports a mixed diet. The patient does not smoke or drink alcohol. The patient has not had recent dental care and is due for a mammogram and Pap smear. The patient and family member expressed concerns about undergoing a colonoscopy due to a past traumatic experience with anesthesia.        Health Maintenance Due  Topic Date Due   DTaP/Tdap/Td (1 - Tdap) Never done   Zoster Vaccines- Shingrix (1 of 2) Never done   Cervical Cancer Screening (HPV/Pap Cotest)  07/24/2021   MAMMOGRAM  02/14/2023   INFLUENZA VACCINE  Never done   COVID-19 Vaccine (3 - 2023-24 season) 07/17/2023   Medicare Annual Wellness (AWV)  07/27/2023    Past Medical History:  Diagnosis Date   History of kidney stones    Seizures (HCC)    from childhood   Stroke (HCC)    61 years [name].    Past Surgical History:  Procedure Laterality Date   APPENDECTOMY     48 years [name]    Family History  Problem Relation Age of Onset   Arthritis Mother    Hyperlipidemia Mother    Hypertension Mother    Diabetes Mother    Stroke Mother    Arthritis Father    Hypertension Father    Arthritis Sister    Hyperlipidemia Sister     Social History   Socioeconomic History   Marital status: Single    Spouse name: Not on file   Number of children: 3   Years of education: Not on file   Highest education level: Not on file  Occupational  History   Not on file  Tobacco Use   Smoking status: Never   Smokeless tobacco: Never  Substance and Sexual Activity   Alcohol use: No   Drug use: No   Sexual activity: Yes  Other Topics Concern   Not on file  Social History Narrative   ** Merged History Encounter ** Regular exercise:     NoCaffeine use:    1 soda daily    3 children   Lives with mother   Social Determinants of Health   Financial Resource Strain: Low Risk  (07/26/2022)   Overall Financial Resource Strain (CARDIA)    Difficulty of Paying Living Expenses: Not very hard  Food Insecurity: No Food Insecurity (07/26/2022)   Hunger Vital Sign    Worried About Running Out of Food in the Last Year: Never true    Ran Out of Food in the Last Year: Never true  Transportation Needs: No Transportation Needs (07/26/2022)   PRAPARE - Administrator, Civil Service (Medical): No    Lack of Transportation (Non-Medical): No  Physical Activity: Inactive (07/26/2022)   Exercise Vital Sign    Days of Exercise per Week: 0 days    Minutes of Exercise per Session: 0 min  Stress: No Stress Concern Present (07/26/2022)   [employer] of Occupational Health - Occupational Stress Questionnaire    Feeling of Stress : Not at all  Social Connections: Moderately Integrated (07/26/2022)   Social Connection and Isolation Panel [NHANES]    Frequency of Communication with Friends and Family: More than three times a week    Frequency of Social Gatherings with Friends and Family: Twice a week    Attends Religious Services: More than 4 times per year    Active Member of [employer] or Organizations: Yes    Attends Engineer, structural: More than 4 times per year    Marital Status: Never married  Intimate Partner Violence: Not At Risk (07/26/2022)   Humiliation, Afraid, Rape, and Kick questionnaire    Fear of Current or Ex-Partner: No    Emotionally Abused: No    Physically Abused: No    Sexually Abused: No    Outpatient  Medications Prior to Visit  Medication Sig Dispense Refill   amLODipine (NORVASC) 10 MG tablet TAKE 1 TABLET BY MOUTH EVERY DAY 90 tablet 1   aspirin EC 81 MG tablet Take 81 mg by mouth daily.     atorvastatin (LIPITOR) 10 MG tablet TAKE 1 TABLET BY MOUTH EVERY DAY 90 tablet 1   Calcium Carbonate-Vitamin D 600-400 MG-UNIT tablet Take 1 tablet by mouth 2 (two) times daily.     No facility-administered medications prior to visit.    Allergies  Allergen Reactions   Penicillins Rash    ROS     Objective:    Physical Exam Exam conducted with a chaperone present.  Constitutional:      General: She is not in acute distress.    Appearance: She is well-developed.  HENT:     Head: Normocephalic and atraumatic.     Right Ear: External ear normal.     Left Ear: External ear normal.     Mouth/Throat:     Mouth: Mucous membranes are moist.     Pharynx: No posterior oropharyngeal erythema.  Eyes:     General: No scleral icterus.    Extraocular Movements: Extraocular movements intact.     Conjunctiva/sclera: Conjunctivae normal.  Neck:     Thyroid: No thyromegaly.  Cardiovascular:     Rate and Rhythm: Normal rate and regular rhythm.     Heart sounds: Normal heart sounds. No murmur heard. Pulmonary:     Effort: Pulmonary effort is normal. No respiratory distress.     Breath sounds: Normal breath sounds. No wheezing.  Abdominal:     General: There is no distension.     Palpations: Abdomen is soft.  Genitourinary:    Pubic Area: No rash.      Labia:        Right: No rash.        Left: No rash.      Vagina: Normal.     Cervix: Normal.     Uterus: Normal.      Adnexa: Right adnexa normal and left adnexa normal.  Musculoskeletal:        General: No swelling.     Cervical back: Neck supple.  Skin:    General: Skin is warm and dry.  Neurological:     Mental Status: She is alert and oriented to person, place, and time.  Psychiatric:        Mood and Affect: Mood normal.         Behavior: Behavior normal.  Thought Content: Thought content normal.        Judgment: Judgment normal.      BP 125/75   Pulse 95   Temp 98.6 F (37 C) (Oral)   Resp 16   Ht 5\' 1"  (1.549 m)   Wt 163 lb (73.9 kg)   LMP 11/15/1957   SpO2 100%   BMI 30.80 kg/m  Wt Readings from Last 3 Encounters:  09/13/23 163 lb (73.9 kg)  05/14/22 167 lb (75.8 kg)  02/10/21 169 lb (76.7 kg)       Assessment & Plan:   Problem List Items Addressed This Visit       Unprioritized   Preventative health care - Primary    Continue healthy diet, exercise.  Refer for mammogram. Pap performed today.  Pt declines colonoscopy but is agreeable to cologuard. Recommended shingrix, tetanus shots at pharmacy.       HTN (hypertension)    Initial BP elevated. Repeat OK.  Will continue amlodipine 10mg .       History of stroke    Continue statin/aspirin, BP control.       Other Visit Diagnoses     Elevated serum protein level       Relevant Orders   Comp Met (CMET)   Protein electrophoresis, serum   Screening for colon cancer       Relevant Orders   Cologuard   Breast cancer screening by mammogram       Relevant Orders   MM 3D SCREENING MAMMOGRAM BILATERAL BREAST       I am having Latasha [name] maintain her aspirin EC, Calcium Carbonate-Vitamin D, amLODipine, and atorvastatin.  No orders of the defined types were placed in this encounter.

## 2023-09-13 NOTE — Assessment & Plan Note (Signed)
Initial BP elevated. Repeat OK.  Will continue amlodipine 10mg .

## 2023-09-13 NOTE — Progress Notes (Deleted)
Subjective:     Patient ID: Latasha [name], female    DOB: 1961-07-25, 62 y.o.   MRN: [id number]  Chief Complaint  Patient presents with  . Annual Exam    HPI  Discussed the use of AI scribe software for clinical note transcription with the patient, who gave verbal consent to proceed.        Health Maintenance Due  Topic Date Due  . HIV Screening  Never done  . Hepatitis C Screening  Never done  . DTaP/Tdap/Td (1 - Tdap) Never done  . Colonoscopy  Never done  . Zoster Vaccines- Shingrix (1 of 2) Never done  . Cervical Cancer Screening (HPV/Pap Cotest)  07/24/2021  . MAMMOGRAM  02/14/2023  . INFLUENZA VACCINE  Never done  . COVID-19 Vaccine (3 - 2023-24 season) 07/17/2023  . Medicare Annual Wellness (AWV)  07/27/2023    Past Medical History:  Diagnosis Date  . History of kidney stones   . Seizures (HCC)    from childhood  . Stroke [facility])    60 years [name].    Past Surgical History:  Procedure Laterality Date  . APPENDECTOMY     55 years [name]    Family History  Problem Relation Age of Onset  . Arthritis Mother   . Hyperlipidemia Mother   . Hypertension Mother   . Diabetes Mother   . Stroke Mother   . Arthritis Father   . Hypertension Father   . Arthritis Sister   . Hyperlipidemia Sister     Social History   Socioeconomic History  . Marital status: Single    Spouse name: Not on file  . Number of children: 3  . Years of education: Not on file  . Highest education level: Not on file  Occupational History  . Not on file  Tobacco Use  . Smoking status: Never  . Smokeless tobacco: Never  Substance and Sexual Activity  . Alcohol use: No  . Drug use: No  . Sexual activity: Yes  Other Topics Concern  . Not on file  Social History Narrative   ** Merged History Encounter **       Regular exercise:  No   Caffeine use: 1 soda daily   3 children   Lives with mother            Social Determinants of Health   Financial Resource Strain: Low  Risk  (07/26/2022)   Overall Financial Resource Strain (CARDIA)   . Difficulty of Paying Living Expenses: Not very hard  Food Insecurity: No Food Insecurity (07/26/2022)   Hunger Vital Sign   . Worried About Programme researcher, broadcasting/film/video in the Last Year: Never true   . Ran Out of Food in the Last Year: Never true  Transportation Needs: No Transportation Needs (07/26/2022)   PRAPARE - Transportation   . Lack of Transportation (Medical): No   . Lack of Transportation (Non-Medical): No  Physical Activity: Inactive (07/26/2022)   Exercise Vital Sign   . Days of Exercise per Week: 0 days   . Minutes of Exercise per Session: 0 min  Stress: No Stress Concern Present (07/26/2022)   [employer] of Occupational Health - Occupational Stress Questionnaire   . Feeling of Stress : Not at all  Social Connections: Moderately Integrated (07/26/2022)   Social Connection and Isolation Panel [NHANES]   . Frequency of Communication with Friends and Family: More than three times a week   . Frequency of Social Gatherings with Friends  and Family: Twice a week   . Attends Religious Services: More than 4 times per year   . Active Member of Clubs or Organizations: Yes   . Attends Banker Meetings: More than 4 times per year   . Marital Status: Never married  Intimate Partner Violence: Not At Risk (07/26/2022)   Humiliation, Afraid, Rape, and Kick questionnaire   . Fear of Current or Ex-Partner: No   . Emotionally Abused: No   . Physically Abused: No   . Sexually Abused: No    Outpatient Medications Prior to Visit  Medication Sig Dispense Refill  . amLODipine (NORVASC) 10 MG tablet TAKE 1 TABLET BY MOUTH EVERY DAY 90 tablet 1  . aspirin EC 81 MG tablet Take 81 mg by mouth daily.    Marland Kitchen atorvastatin (LIPITOR) 10 MG tablet TAKE 1 TABLET BY MOUTH EVERY DAY 90 tablet 1  . Calcium Carbonate-Vitamin D 600-400 MG-UNIT tablet Take 1 tablet by mouth 2 (two) times daily.     No facility-administered medications  prior to visit.    Allergies  Allergen Reactions  . Penicillins Rash    ROS     Objective:    Physical Exam   BP (!) 148/83 (BP Location: Left Arm, Patient Position: Sitting, Cuff Size: Normal)   Pulse 95   Temp 98.6 F (37 C) (Oral)   Resp 16   Ht 5\' 1"  (1.549 m)   Wt 163 lb (73.9 kg)   LMP 11/15/1957   SpO2 100%   BMI 30.80 kg/m  Wt Readings from Last 3 Encounters:  09/13/23 163 lb (73.9 kg)  05/14/22 167 lb (75.8 kg)  02/10/21 169 lb (76.7 kg)       Assessment & Plan:   Problem List Items Addressed This Visit   None   I am having Janisha J. Sane maintain her aspirin EC, Calcium Carbonate-Vitamin D, amLODipine, and atorvastatin.  No orders of the defined types were placed in this encounter.

## 2023-09-13 NOTE — Assessment & Plan Note (Addendum)
Continue healthy diet, exercise.  Refer for mammogram. Pap performed today.  Pt declines colonoscopy but is agreeable to cologuard. Recommended shingrix, tetanus shots at pharmacy.

## 2023-09-14 ENCOUNTER — Telehealth: Payer: Self-pay | Admitting: *Deleted

## 2023-09-14 NOTE — Addendum Note (Signed)
Addended by: Mervin Kung A on: 09/14/2023 11:44 AM   Modules accepted: Orders

## 2023-09-14 NOTE — Telephone Encounter (Signed)
Received call from [facility] lab that they are unable to run the cmp and lipid panel from yesterday.  Specimen is very hemolyzed and would compromise results. They are asking to have pt return for redraw of these 2 tests. Left message for pt to return my call and future orders have been placed.

## 2023-09-15 ENCOUNTER — Encounter: Payer: Self-pay | Admitting: Family

## 2023-09-15 LAB — PROTEIN ELECTROPHORESIS, SERUM
Albumin ELP: 4.1 g/dL (ref 3.8–4.8)
Alpha 1: 0.4 g/dL — ABNORMAL HIGH (ref 0.2–0.3)
Alpha 2: 0.9 g/dL (ref 0.5–0.9)
Beta 2: 0.5 g/dL (ref 0.2–0.5)
Beta Globulin: 0.4 g/dL (ref 0.4–0.6)
Gamma Globulin: 2.8 g/dL — ABNORMAL HIGH (ref 0.8–1.7)
Total Protein: 9.1 g/dL — ABNORMAL HIGH (ref 6.1–8.1)

## 2023-09-15 LAB — CYTOLOGY - PAP
Comment: NEGATIVE
Diagnosis: NEGATIVE
High risk HPV: NEGATIVE

## 2023-09-16 ENCOUNTER — Other Ambulatory Visit: Payer: PPO

## 2023-09-16 ENCOUNTER — Telehealth: Payer: Self-pay | Admitting: Family

## 2023-09-16 DIAGNOSIS — E785 Hyperlipidemia, unspecified: Secondary | ICD-10-CM | POA: Diagnosis not present

## 2023-09-16 DIAGNOSIS — R779 Abnormality of plasma protein, unspecified: Secondary | ICD-10-CM | POA: Diagnosis not present

## 2023-09-16 NOTE — Progress Notes (Signed)
Letter mailed out. Done 

## 2023-09-16 NOTE — Telephone Encounter (Signed)
I had already drawn her blood blood before I saw message but I can try and add on on Monday.

## 2023-09-16 NOTE — Addendum Note (Signed)
Addended by: Thelma Barge D on: 09/16/2023 02:38 PM   Modules accepted: Orders

## 2023-09-16 NOTE — Telephone Encounter (Signed)
Spoke with pts mother and she stated that she will try and have her granddaughter to bring pt back in.  Advised her to tell pt to drink plenty of water.

## 2023-09-16 NOTE — Telephone Encounter (Signed)
Can we please request immunofixation of the Serum Protein Electrophoresis with the lab?

## 2023-09-19 ENCOUNTER — Encounter (HOSPITAL_BASED_OUTPATIENT_CLINIC_OR_DEPARTMENT_OTHER): Payer: Self-pay

## 2023-09-19 ENCOUNTER — Ambulatory Visit (HOSPITAL_BASED_OUTPATIENT_CLINIC_OR_DEPARTMENT_OTHER)
Admission: RE | Admit: 2023-09-19 | Discharge: 2023-09-19 | Disposition: A | Discharge status: Home / Self Care | Payer: PPO | Source: Ambulatory Visit | Attending: Family | Admitting: Family

## 2023-09-19 DIAGNOSIS — Z1231 Encounter for screening mammogram for malignant neoplasm of breast: Secondary | ICD-10-CM | POA: Insufficient documentation

## 2023-09-19 NOTE — Telephone Encounter (Signed)
Add on request placed in [facility] portal.

## 2023-09-23 LAB — COMPREHENSIVE METABOLIC PANEL
AG Ratio: 1.1 (calc) (ref 1.0–2.5)
ALT: 13 U/L (ref 6–29)
AST: 19 U/L (ref 10–35)
Albumin: 4.1 g/dL (ref 3.6–5.1)
Alkaline phosphatase (APISO): 101 U/L (ref 37–153)
BUN: 10 mg/dL (ref 7–25)
CO2: 28 mmol/L (ref 20–32)
Calcium: 9.6 mg/dL (ref 8.6–10.4)
Chloride: 103 mmol/L (ref 98–110)
Creat: 0.66 mg/dL (ref 0.50–1.05)
Globulin: 3.9 g/dL — ABNORMAL HIGH (ref 1.9–3.7)
Glucose, Bld: 100 mg/dL — ABNORMAL HIGH (ref 65–99)
Potassium: 3.6 mmol/L (ref 3.5–5.3)
Sodium: 138 mmol/L (ref 135–146)
Total Bilirubin: 0.5 mg/dL (ref 0.2–1.2)
Total Protein: 8 g/dL (ref 6.1–8.1)

## 2023-09-23 LAB — LIPID PANEL
Cholesterol: 100 mg/dL (ref <200)
HDL: 38 mg/dL — ABNORMAL LOW (ref >=50)
LDL Cholesterol (Calc): 46 mg/dL
Non-HDL Cholesterol (Calc): 62 mg/dL (ref <130)
Total CHOL/HDL Ratio: 2.6 (calc) (ref <5.0)
Triglycerides: 81 mg/dL (ref <150)

## 2023-09-23 LAB — IFE INTERPRETATION

## 2023-09-28 ENCOUNTER — Telehealth: Payer: Self-pay | Admitting: Family

## 2023-09-28 DIAGNOSIS — R778 Other specified abnormalities of plasma proteins: Secondary | ICD-10-CM | POA: Insufficient documentation

## 2023-09-28 NOTE — Telephone Encounter (Signed)
Patient's mother notified of results and referral.

## 2023-09-28 NOTE — Telephone Encounter (Signed)
Please advise mother that pt has some abnormal proteins in her blood. I would like for her to see hematology for further evaluation.  Order has been placed.

## 2023-10-25 ENCOUNTER — Encounter: Payer: Self-pay | Admitting: Medical Oncology

## 2023-10-25 ENCOUNTER — Inpatient Hospital Stay: Payer: PPO | Attending: Hematology & Oncology

## 2023-10-25 ENCOUNTER — Inpatient Hospital Stay (HOSPITAL_BASED_OUTPATIENT_CLINIC_OR_DEPARTMENT_OTHER): Payer: PPO | Admitting: Medical Oncology

## 2023-10-25 VITALS — BP 131/75 | HR 82 | Temp 97.9°F | Resp 18 | Ht 61.0 in | Wt 160.0 lb

## 2023-10-25 DIAGNOSIS — R778 Other specified abnormalities of plasma proteins: Secondary | ICD-10-CM

## 2023-10-25 DIAGNOSIS — D649 Anemia, unspecified: Secondary | ICD-10-CM | POA: Insufficient documentation

## 2023-10-25 LAB — CMP (CANCER CENTER ONLY)
ALT: 11 U/L (ref 0–44)
AST: 16 U/L (ref 15–41)
Albumin: 3.9 g/dL (ref 3.5–5.0)
Alkaline Phosphatase: 82 U/L (ref 38–126)
Anion gap: 7 (ref 5–15)
BUN: 10 mg/dL (ref 8–23)
CO2: 27 mmol/L (ref 22–32)
Calcium: 9.5 mg/dL (ref 8.9–10.3)
Chloride: 102 mmol/L (ref 98–111)
Creatinine: 0.81 mg/dL (ref 0.44–1.00)
GFR, Estimated: >60 mL/min (ref >60)
Glucose, Bld: 103 mg/dL — ABNORMAL HIGH (ref 70–99)
Potassium: 3.4 mmol/L — ABNORMAL LOW (ref 3.5–5.1)
Sodium: 136 mmol/L (ref 135–145)
Total Bilirubin: 0.9 mg/dL (ref <1.2)
Total Protein: 8.3 g/dL — ABNORMAL HIGH (ref 6.5–8.1)

## 2023-10-25 LAB — CBC WITH DIFFERENTIAL (CANCER CENTER ONLY)
Abs Immature Granulocytes: 0.01 10*3/uL (ref 0.00–0.07)
Basophils Absolute: 0 10*3/uL (ref 0.0–0.1)
Basophils Relative: 0 %
Eosinophils Absolute: 0 10*3/uL (ref 0.0–0.5)
Eosinophils Relative: 0 %
HCT: 33.7 % — ABNORMAL LOW (ref 36.0–46.0)
Hemoglobin: 10.9 g/dL — ABNORMAL LOW (ref 12.0–15.0)
Immature Granulocytes: 0 %
Lymphocytes Relative: 27 %
Lymphs Abs: 1.1 10*3/uL (ref 0.7–4.0)
MCH: 28.3 pg (ref 26.0–34.0)
MCHC: 32.3 g/dL (ref 30.0–36.0)
MCV: 87.5 fL (ref 80.0–100.0)
Monocytes Absolute: 0.4 10*3/uL (ref 0.1–1.0)
Monocytes Relative: 10 %
Neutro Abs: 2.5 10*3/uL (ref 1.7–7.7)
Neutrophils Relative %: 63 %
Platelet Count: 305 10*3/uL (ref 150–400)
RBC: 3.85 MIL/uL — ABNORMAL LOW (ref 3.87–5.11)
RDW: 13.7 % (ref 11.5–15.5)
WBC Count: 4 10*3/uL (ref 4.0–10.5)
nRBC: 0 % (ref 0.0–0.2)

## 2023-10-25 LAB — LACTATE DEHYDROGENASE: LDH: 168 U/L (ref 98–192)

## 2023-10-25 NOTE — Progress Notes (Unsigned)
[facility] [phone]   [phone]  INITIAL CONSULT NOTE  Patient Care Team: Sandford Craze, NP as PCP - General (Internal Medicine) Karel Jarvis Lesle Chris, MD as Consulting Physician (Neurology)  CHIEF COMPLAINTS/PURPOSE OF CONSULTATION:  Abnormal SPEP   HISTORY OF PRESENTING ILLNESS:  Latasha [name] 62 y.o. female is referred to our office by their PCP for an abnormal SPEP result. She is here with her daughter who helps with history collection.   They had labs drawn on 09/13/2023 which showed a elevated gamma globulin of 2.8 with interpretation of "poorly- defined band of restricted protein... that may represent a monoclonal protein". This was performed due to a slightly elevated Globulin level on her latest CMET. She is here for additional evaluation.   No history of anemia- No No excessive fatigue- No No bleeding or excessive bruising- No No unintentional weight loss or night sweats- No No new bone pains- No No history of kidney disease- No No family history of blood or bone cancers- No Occupation: never worked due to her seizure disorder  Hazard exposure: None  Wt Readings from Last 3 Encounters:  10/25/23 160 lb (72.6 kg)  09/13/23 163 lb (73.9 kg)  05/14/22 167 lb (75.8 kg)    MEDICAL HISTORY:  Past Medical History:  Diagnosis Date   History of kidney stones    Seizures (HCC)    from childhood   Stroke (HCC)    62 years old.    SURGICAL HISTORY: Past Surgical History:  Procedure Laterality Date   APPENDECTOMY     62 years old    SOCIAL HISTORY: Social History   Socioeconomic History   Marital status: Single    Spouse name: Not on file   Number of children: 3   Years of education: Not on file   Highest education level: Not on file  Occupational History   Not on file  Tobacco Use   Smoking status: Never   Smokeless tobacco: Never  Vaping Use   Vaping status: Never Used  Substance and Sexual Activity    Alcohol use: No   Drug use: No   Sexual activity: Yes  Other Topics Concern   Not on file  Social History Narrative   ** Merged History Encounter ** Regular exercise:     NoCaffeine use:    1 soda daily    3 children   Lives with mother   Social Drivers of Corporate investment banker Strain: Low Risk  (07/26/2022)   Overall Financial Resource Strain (CARDIA)    Difficulty of Paying Living Expenses: Not very hard  Food Insecurity: No Food Insecurity (10/25/2023)   Hunger Vital Sign    Worried About Running Out of Food in the Last Year: Never true    Ran Out of Food in the Last Year: Never true  Transportation Needs: No Transportation Needs (10/25/2023)   PRAPARE - Administrator, Civil Service (Medical): No    Lack of Transportation (Non-Medical): No  Physical Activity: Inactive (07/26/2022)   Exercise Vital Sign    Days of Exercise per Week: 0 days    Minutes of Exercise per Session: 0 min  Stress: No Stress Concern Present (07/26/2022)   [employer] of Occupational Health - Occupational Stress Questionnaire    Feeling of Stress : Not at all  Social Connections: Moderately Integrated (07/26/2022)   Social Connection and Isolation Panel [NHANES]    Frequency of Communication with Friends and Family: More than three  times a week    Frequency of Social Gatherings with Friends and Family: Twice a week    Attends Religious Services: More than 4 times per year    Active Member of [employer] or Organizations: Yes    Attends Engineer, structural: More than 4 times per year    Marital Status: Never married  Intimate Partner Violence: Not At Risk (10/25/2023)   Humiliation, Afraid, Rape, and Kick questionnaire    Fear of Current or Ex-Partner: No    Emotionally Abused: No    Physically Abused: No    Sexually Abused: No    FAMILY HISTORY: Family History  Problem Relation Age of Onset   Arthritis Mother    Hyperlipidemia Mother    Hypertension Mother     Diabetes Mother    Stroke Mother    Arthritis Father    Hypertension Father    Arthritis Sister    Hyperlipidemia Sister     ALLERGIES:  is allergic to penicillins.  MEDICATIONS:  Current Outpatient Medications  Medication Sig Dispense Refill   amLODipine (NORVASC) 10 MG tablet TAKE 1 TABLET BY MOUTH EVERY DAY 90 tablet 1   aspirin EC 81 MG tablet Take 81 mg by mouth daily.     atorvastatin (LIPITOR) 10 MG tablet TAKE 1 TABLET BY MOUTH EVERY DAY 90 tablet 1   Calcium Carbonate-Vitamin D 600-400 MG-UNIT tablet Take 1 tablet by mouth 2 (two) times daily.     No current facility-administered medications for this visit.    REVIEW OF SYSTEMS:   Constitutional: ( - ) fevers, ( - )  chills , ( - ) night sweats Eyes: ( - ) blurriness of vision, ( - ) double vision, ( - ) watery eyes Ears, nose, mouth, throat, and face: ( - ) mucositis, ( - ) sore throat Respiratory: ( - ) cough, ( - ) dyspnea, ( - ) wheezes Cardiovascular: ( - ) palpitation, ( - ) chest discomfort, ( - ) lower extremity swelling Gastrointestinal:  ( - ) nausea, ( - ) heartburn, ( - ) change in bowel habits Skin: ( - ) abnormal skin rashes Lymphatics: ( - ) new lymphadenopathy, ( - ) easy bruising Neurological: ( - ) numbness, ( - ) tingling, ( - ) new weaknesses Behavioral/Psych: ( - ) mood change, ( - ) new changes  All other systems were reviewed with the patient and are negative.  PHYSICAL EXAMINATION: ECOG PERFORMANCE STATUS: 0 - Asymptomatic  Vitals:   10/25/23 1134  BP: 131/75  Pulse: 82  Resp: 18  Temp: 97.9 F (36.6 C)  SpO2: 100%   Filed Weights   10/25/23 1134  Weight: 160 lb (72.6 kg)    GENERAL: well appearing female in NAD  SKIN: skin color, texture, turgor are normal, no rashes or significant lesions EYES: conjunctiva are pink and non-injected, sclera clear OROPHARYNX: no exudate, no erythema; lips, buccal mucosa, and tongue normal  NECK: supple, non-tender LYMPH:  no palpable  lymphadenopathy in the cervical, axillary or supraclavicular lymph nodes.  LUNGS: clear to auscultation and percussion with normal breathing effort HEART: regular rate & rhythm and no murmurs and no lower extremity edema ABDOMEN: soft, non-tender, non-distended, normal bowel sounds Musculoskeletal: no cyanosis of digits and no clubbing  PSYCH: alert & oriented x 3, fluent speech NEURO: no focal motor/sensory deficits  LABORATORY DATA:  Pending   ASSESSMENT & PLAN Latasha [name] is a 62 y.o. female who was referred to [place] for further  assessment of a elevated gamma globulin.   We had a long discussion about what a gamma globulin is and some reasons why her initial test may have been abnormal. We also discussed additional tests that have been ordered today and how they will help [place] determine our concern level. At this time my concern level for MM is low. We did discussed MGUS in detail as this is likely. We discussed monitoring of labs and red flag signs and symptoms.   Our plan at this time is for her to have additional labs completed today and then to return in about 3 months for repeat labs including labs for her mild anemia found today on CBC.   RTC 3 months MD, labs (CBC w/, CMP, LDH, MM panel, light chains, ferritin, iron, B12, folate, erythropoietin)  All questions were answered. The patient knows to call the clinic with any problems, questions or concerns.  I have spent a total of 40 minutes minutes of face-to-face and non-face-to-face time, preparing to see the patient, obtaining and/or reviewing separately obtained history, performing a medically appropriate examination, counseling and educating the patient, ordering medications/tests/procedures, referring and communicating with other health care professionals, documenting clinical information in the electronic health record, independently interpreting results and communicating results to the patient, and care coordination.     Clent Jacks PA-C Department of Hematology/Oncology [facility]

## 2023-10-26 LAB — KAPPA/LAMBDA LIGHT CHAINS
Kappa free light chain: 38 mg/L — ABNORMAL HIGH (ref 3.3–19.4)
Kappa, lambda light chain ratio: 1.22 (ref 0.26–1.65)
Lambda free light chains: 31.2 mg/L — ABNORMAL HIGH (ref 5.7–26.3)

## 2023-10-26 LAB — BETA 2 MICROGLOBULIN, SERUM: Beta-2 Microglobulin: 1.8 mg/L (ref 0.6–2.4)

## 2023-10-26 LAB — IGG, IGA, IGM
IgA: 284 mg/dL (ref 87–352)
IgG (Immunoglobin G), Serum: 2772 mg/dL — ABNORMAL HIGH (ref 586–1602)
IgM (Immunoglobulin M), Srm: 76 mg/dL (ref 26–217)

## 2023-10-28 LAB — PROTEIN ELECTROPHORESIS, SERUM, WITH REFLEX
A/G Ratio: 0.9 (ref 0.7–1.7)
Albumin ELP: 3.8 g/dL (ref 2.9–4.4)
Alpha-1-Globulin: 0.2 g/dL (ref 0.0–0.4)
Alpha-2-Globulin: 0.7 g/dL (ref 0.4–1.0)
Beta Globulin: 1 g/dL (ref 0.7–1.3)
Gamma Globulin: 2.5 g/dL — ABNORMAL HIGH (ref 0.4–1.8)
Globulin, Total: 4.4 g/dL — ABNORMAL HIGH (ref 2.2–3.9)
Total Protein ELP: 8.2 g/dL (ref 6.0–8.5)

## 2023-12-27 ENCOUNTER — Other Ambulatory Visit: Payer: Self-pay | Admitting: Family

## 2024-01-23 ENCOUNTER — Inpatient Hospital Stay: Payer: PPO | Attending: Hematology & Oncology

## 2024-01-23 ENCOUNTER — Inpatient Hospital Stay: Payer: PPO | Admitting: Hematology & Oncology

## 2024-02-16 ENCOUNTER — Telehealth: Payer: Self-pay | Admitting: *Deleted

## 2024-02-16 NOTE — Telephone Encounter (Signed)
 Patient's sister and care given notified the labs were ordered by Clent Jacks during ov at oncology/hematology.  She will contact them to follow up

## 2024-02-16 NOTE — Telephone Encounter (Signed)
 Copied from CRM [id number]. Topic: General - Other >> Feb 16, 2024  8:24 AM Martinique E wrote: Reason for CRM: Patient's sister, Luster Landsberg, called in wanting clarification on lab work that was ordered for the patient. Renee stated that patient's Hematologist stated that PCP ordered labs to be done, agent saw future lab orders in chart, but Renee wants clarification as to why these labs were ordered. Callback number for Luster Landsberg is [phone], and she is available anytime after 12pm.

## 2024-02-21 ENCOUNTER — Encounter: Payer: Self-pay | Admitting: Medical Oncology

## 2024-02-21 ENCOUNTER — Inpatient Hospital Stay: Attending: Hematology & Oncology

## 2024-02-21 ENCOUNTER — Inpatient Hospital Stay: Admitting: Medical Oncology

## 2024-02-21 VITALS — BP 126/67 | HR 85 | Temp 97.5°F | Resp 18 | Ht 61.0 in | Wt 149.0 lb

## 2024-02-21 DIAGNOSIS — R778 Other specified abnormalities of plasma proteins: Secondary | ICD-10-CM | POA: Diagnosis not present

## 2024-02-21 DIAGNOSIS — D75839 Thrombocytosis, unspecified: Secondary | ICD-10-CM | POA: Diagnosis not present

## 2024-02-21 DIAGNOSIS — D649 Anemia, unspecified: Secondary | ICD-10-CM

## 2024-02-21 DIAGNOSIS — Z79899 Other long term (current) drug therapy: Secondary | ICD-10-CM | POA: Diagnosis not present

## 2024-02-21 DIAGNOSIS — Z7982 Long term (current) use of aspirin: Secondary | ICD-10-CM | POA: Insufficient documentation

## 2024-02-21 LAB — IRON AND IRON BINDING CAPACITY (CC-WL,HP ONLY)
Iron: 42 ug/dL (ref 28–170)
Saturation Ratios: 16 % (ref 10.4–31.8)
TIBC: 259 ug/dL (ref 250–450)
UIBC: 217 ug/dL (ref 148–442)

## 2024-02-21 LAB — CBC WITH DIFFERENTIAL (CANCER CENTER ONLY)
Abs Immature Granulocytes: 0.02 10*3/uL (ref 0.00–0.07)
Basophils Absolute: 0 10*3/uL (ref 0.0–0.1)
Basophils Relative: 0 %
Eosinophils Absolute: 0 10*3/uL (ref 0.0–0.5)
Eosinophils Relative: 0 %
HCT: 34.2 % — ABNORMAL LOW (ref 36.0–46.0)
Hemoglobin: 11.1 g/dL — ABNORMAL LOW (ref 12.0–15.0)
Immature Granulocytes: 1 %
Lymphocytes Relative: 15 %
Lymphs Abs: 0.6 10*3/uL — ABNORMAL LOW (ref 0.7–4.0)
MCH: 27.3 pg (ref 26.0–34.0)
MCHC: 32.5 g/dL (ref 30.0–36.0)
MCV: 84 fL (ref 80.0–100.0)
Monocytes Absolute: 0.4 10*3/uL (ref 0.1–1.0)
Monocytes Relative: 9 %
Neutro Abs: 3.1 10*3/uL (ref 1.7–7.7)
Neutrophils Relative %: 75 %
Platelet Count: 410 10*3/uL — ABNORMAL HIGH (ref 150–400)
RBC: 4.07 MIL/uL (ref 3.87–5.11)
RDW: 14.6 % (ref 11.5–15.5)
WBC Count: 4.1 10*3/uL (ref 4.0–10.5)
nRBC: 0 % (ref 0.0–0.2)

## 2024-02-21 LAB — CMP (CANCER CENTER ONLY)
ALT: 10 U/L (ref 0–44)
AST: 20 U/L (ref 15–41)
Albumin: 3.8 g/dL (ref 3.5–5.0)
Alkaline Phosphatase: 77 U/L (ref 38–126)
Anion gap: 8 (ref 5–15)
BUN: 6 mg/dL — ABNORMAL LOW (ref 8–23)
CO2: 27 mmol/L (ref 22–32)
Calcium: 9.5 mg/dL (ref 8.9–10.3)
Chloride: 102 mmol/L (ref 98–111)
Creatinine: 0.52 mg/dL (ref 0.44–1.00)
GFR, Estimated: >60 mL/min (ref >60)
Glucose, Bld: 103 mg/dL — ABNORMAL HIGH (ref 70–99)
Potassium: 3.3 mmol/L — ABNORMAL LOW (ref 3.5–5.1)
Sodium: 137 mmol/L (ref 135–145)
Total Bilirubin: 0.7 mg/dL (ref 0.0–1.2)
Total Protein: 8.1 g/dL (ref 6.5–8.1)

## 2024-02-21 LAB — FERRITIN: Ferritin: 228 ng/mL (ref 11–307)

## 2024-02-21 LAB — VITAMIN B12: Vitamin B-12: 296 pg/mL (ref 180–914)

## 2024-02-21 LAB — LACTATE DEHYDROGENASE: LDH: 198 U/L — ABNORMAL HIGH (ref 98–192)

## 2024-02-21 NOTE — Progress Notes (Signed)
 Hematology and Oncology Follow Up Visit  Latasha [name] [id number] [birth year] 63 y.o. 02/21/2024  Past Medical History:  Diagnosis Date   History of kidney stones    Seizures (HCC)    from childhood   Stroke (HCC)    63 years old.    Principle Diagnosis:  Abnormal SPEP- Elevated gamma globulin   Current Therapy:   Observation      Interim History:  Latasha [name] is back for follow-up for elevated gamma globulin:  She is here with her sister Latasha [name] today who she verbalizes to me is her health care point of contact for lab results.   She reports that she has been well since her last visit. No concerns or complaints  There has been no bleeding to her knowledge: denies epistaxis, gingivitis, hemoptysis, hematemesis, hematuria, melena, excessive bruising, blood donation.   No infections, SOB, night sweats, new bone pains.     Weight loss has been intentional  Wt Readings from Last 3 Encounters:  02/21/24 149 lb (67.6 kg)  10/25/23 160 lb (72.6 kg)  09/13/23 163 lb (73.9 kg)     Medications:   Current Outpatient Medications:    amLODipine (NORVASC) 10 MG tablet, TAKE 1 TABLET BY MOUTH EVERY DAY, Disp: 90 tablet, Rfl: 0   aspirin EC 81 MG tablet, Take 81 mg by mouth daily., Disp: , Rfl:    atorvastatin (LIPITOR) 10 MG tablet, TAKE 1 TABLET BY MOUTH EVERY DAY, Disp: 90 tablet, Rfl: 0   Calcium Carbonate-Vitamin D 600-400 MG-UNIT tablet, Take 1 tablet by mouth 2 (two) times daily., Disp: , Rfl:   Allergies:  Allergies  Allergen Reactions   Penicillins Rash    Past Medical History, Surgical history, Social history, and Family History were reviewed and updated.  Review of Systems: Review of Systems  Constitutional: Negative.   HENT:  Negative.    Eyes: Negative.   Respiratory: Negative.    Cardiovascular: Negative.   Gastrointestinal: Negative.   Endocrine: Negative.   Genitourinary: Negative.    Musculoskeletal: Negative.   Skin: Negative.   Neurological:  Negative.   Hematological: Negative.   Psychiatric/Behavioral: Negative.       Physical Exam:  height is 5\' 1"  (1.549 m) and weight is 149 lb (67.6 kg). Her oral temperature is 97.5 F (36.4 C) (abnormal). Her blood pressure is 126/67 and her pulse is 85. Her respiration is 18 and oxygen saturation is 100%.   Physical Exam General: NAD Cardiovascular: regular rate and rhythm Pulmonary: clear ant fields Abdomen: soft, nontender, + bowel sounds GU: no suprapubic tenderness Extremities: no edema, no joint deformities Skin: no rashes Neurological: Weakness but otherwise nonfocal   Lab Results  Component Value Date   WBC 4.1 02/21/2024   HGB 11.1 (L) 02/21/2024   HCT 34.2 (L) 02/21/2024   MCV 84.0 02/21/2024   PLT 410 (H) 02/21/2024     Chemistry      Component Value Date/Time   NA 136 10/25/2023 1042   K 3.4 (L) 10/25/2023 1042   CL 102 10/25/2023 1042   CO2 27 10/25/2023 1042   BUN 10 10/25/2023 1042   CREATININE 0.81 10/25/2023 1042   CREATININE 0.66 09/16/2023 1606      Component Value Date/Time   CALCIUM 9.5 10/25/2023 1042   ALKPHOS 82 10/25/2023 1042   AST 16 10/25/2023 1042   ALT 11 10/25/2023 1042   BILITOT 0.9 10/25/2023 1042     No diagnosis found.  Assessment and Plan- Patient is a  63 y.o. female who was referred to our office for an elevated gamma globulin. Initial MM studies showed no M spike. Additional labs including nutritional studies pending today.   Happy to hear that she is doing well.  CBC looks a bit improved in terms of Hgb- I do question if she may have iron deficiency though given her mild thrombocytosis. Advisements to be made following completion of nutritional lab work.    Disposition: RTC 6 months APP, labs (CBC w/, CMP, iron, ferritin, B12, folate, retic, MM panel, light chains, LDH)   Clent Jacks PA-C 4/8/20259:56 AM

## 2024-02-22 LAB — KAPPA/LAMBDA LIGHT CHAINS
Kappa free light chain: 41.2 mg/L — ABNORMAL HIGH (ref 3.3–19.4)
Kappa, lambda light chain ratio: 0.98 (ref 0.26–1.65)
Lambda free light chains: 42.1 mg/L — ABNORMAL HIGH (ref 5.7–26.3)

## 2024-02-22 LAB — ERYTHROPOIETIN: Erythropoietin: 20 m[IU]/mL — ABNORMAL HIGH (ref 2.6–18.5)

## 2024-02-23 LAB — MULTIPLE MYELOMA PANEL, SERUM
Albumin SerPl Elph-Mcnc: 3.4 g/dL (ref 2.9–4.4)
Albumin/Glob SerPl: 0.8 (ref 0.7–1.7)
Alpha 1: 0.2 g/dL (ref 0.0–0.4)
Alpha2 Glob SerPl Elph-Mcnc: 0.7 g/dL (ref 0.4–1.0)
B-Globulin SerPl Elph-Mcnc: 0.9 g/dL (ref 0.7–1.3)
Gamma Glob SerPl Elph-Mcnc: 2.5 g/dL — ABNORMAL HIGH (ref 0.4–1.8)
Globulin, Total: 4.4 g/dL — ABNORMAL HIGH (ref 2.2–3.9)
IgA: 290 mg/dL (ref 87–352)
IgG (Immunoglobin G), Serum: 2829 mg/dL — ABNORMAL HIGH (ref 586–1602)
IgM (Immunoglobulin M), Srm: 107 mg/dL (ref 26–217)
Total Protein ELP: 7.8 g/dL (ref 6.0–8.5)

## 2024-02-24 ENCOUNTER — Telehealth: Payer: Self-pay | Admitting: *Deleted

## 2024-02-24 NOTE — Telephone Encounter (Signed)
-----   Message from Rushie Chestnut sent at 02/23/2024  2:09 PM EDT ----- Call sister Luster Landsberg (Primary contact number)- She should start a B12 once daily 1,000 mcg supplement as her B12 is low. All other labs look stable.

## 2024-02-24 NOTE — Telephone Encounter (Signed)
 Called pt home and mobile # unable to reach. LMOVM with instructions to take B12 1 tablet daily. Pt to call office with any concerns.

## 2024-03-12 ENCOUNTER — Ambulatory Visit: Payer: Self-pay

## 2024-03-12 NOTE — Telephone Encounter (Signed)
 Copied from CRM [id number]. Topic: Appointments - Appointment Scheduling >> Mar 12, 2024  3:41 PM Ovid Blow wrote: Patient/patient representative is calling to schedule an appointment. Patient's sister Adell Hones stated that they were at an event on Saturday and patient got hot and started starring and fell. May have been dehydrated and may have had a seizure. Has had seizures before 3-4 years ago  Chief Complaint: "Felt hot" Symptoms: Denies Frequency: This weekend Pertinent Negatives: Patient denies fainting/falling Disposition: [] ED /[] Urgent Care (no appt availability in office) / [] Appointment(In office/virtual)/ []  [facility]/ [x] Home Care/ [] Refused Recommended Disposition /[]  Mobile Bus/ []  Follow-up with PCP Additional Notes: This RN was able to get in contact with patient. Patient stated she "felt hot" at an event outside this weekend. Patient stated she had to rest her head on her sister's shoulder, but it was nothing more than that. Patient denied feeling lightheaded, passing out, falling or seizure activity. Advised home care at this time, per protocol. Provided care advice and instructed patient to call back if symptoms worsen. Patient complied.   Reason for Disposition  Dizziness caused by recent heat exposure  Answer Assessment - Initial Assessment Questions 1. DESCRIPTION: "Describe your dizziness."     States she "felt hot" at an event this weekend 2. LIGHTHEADED: "Do you feel lightheaded?" (e.g., somewhat faint, woozy, weak upon standing)     Denies 4. SEVERITY: "How bad is it?"  "Do you feel like you are going to faint?" "Can you stand and walk?"   - MILD: Feels slightly dizzy, but walking normally.   - MODERATE: Feels unsteady when walking, but not falling; interferes with normal activities (e.g., school, work).   - SEVERE: Unable to walk without falling, or requires assistance to walk without falling; feels like passing out now.      Denies fainting and  falling 5. ONSET:  "When did the dizziness begin?"     States she "felt hot" this weekend, but did not describe it as dizzy 6. AGGRAVATING FACTORS: "Does anything make it worse?" (e.g., standing, change in head position)     Heat 8. CAUSE: "What do you think is causing the dizziness?"     Heat 9. RECURRENT SYMPTOM: "Have you had dizziness before?" If Yes, ask: "When was the last time?" "What happened that time?"     Denies 10. OTHER SYMPTOMS: "Do you have any other symptoms?" (e.g., fever, chest pain, vomiting, diarrhea, bleeding)       Denies fainting, denies falling, denies seizure activity, denies dizziness  Protocols used: Dizziness - Lightheadedness-A-AH

## 2024-03-14 ENCOUNTER — Ambulatory Visit: Admitting: Family

## 2024-03-14 VITALS — BP 127/80 | HR 103 | Temp 98.0°F | Resp 16 | Ht 61.0 in | Wt 147.0 lb

## 2024-03-14 DIAGNOSIS — Z8673 Personal history of transient ischemic attack (TIA), and cerebral infarction without residual deficits: Secondary | ICD-10-CM

## 2024-03-14 DIAGNOSIS — R55 Syncope and collapse: Secondary | ICD-10-CM | POA: Diagnosis not present

## 2024-03-14 DIAGNOSIS — E876 Hypokalemia: Secondary | ICD-10-CM | POA: Diagnosis not present

## 2024-03-14 DIAGNOSIS — I10 Essential (primary) hypertension: Secondary | ICD-10-CM | POA: Diagnosis not present

## 2024-03-14 DIAGNOSIS — G40909 Epilepsy, unspecified, not intractable, without status epilepticus: Secondary | ICD-10-CM

## 2024-03-14 NOTE — Assessment & Plan Note (Signed)
 BP Readings from Last 3 Encounters:  03/14/24 127/80  02/21/24 126/67  10/25/23 131/75  At goal on amlodipine  10mg .

## 2024-03-14 NOTE — Assessment & Plan Note (Signed)
 Continue aspirin statin for secondary prevention.

## 2024-03-14 NOTE — Progress Notes (Signed)
 Subjective:     Patient ID: Latasha [name], female    DOB: [birth year], 63 y.o.   MRN: [id number]  Chief Complaint  Patient presents with   Loss of Consciousness    Patient's sister and daughter report loss patient passing out 03/10/24    HPI  Discussed the use of AI scribe software for clinical note transcription with the patient, who gave verbal consent to proceed.  History of Present Illness  Latasha [name] is a 63 year old female who presents with a recent episode of loss of consciousness and possible seizure. She lost consciousness while standing outside in a hot environment at an event called '[employer]'. Her sister observed that she was briefly unconscious, and upon regaining consciousness, she was disoriented, vomited slightly, and had foaming at the mouth, resembling a seizure. This episode is similar to a previous incident at a hospital where she also experienced loss of consciousness and seizure-like activity. She is on amlodipine  10 mg for hypertension and takes aspirin 81 mg daily. Her blood pressure is 127/80 mmHg. Her cholesterol is managed to prevent stroke, though it was not particularly high. She denies recent chest pain, palpitations, or dizziness apart from the recent episode. She did not experience any pre-syncopal symptoms before losing consciousness. Her potassium levels were low a few weeks ago.  Lab Results  Component Value Date   CHOL 100 09/16/2023   HDL 38 (L) 09/16/2023   LDLCALC 46 09/16/2023   TRIG 81 09/16/2023   CHOLHDL 2.6 09/16/2023       Health Maintenance Due  Topic Date Due   DTaP/Tdap/Td (1 - Tdap) Never done   Zoster Vaccines- Shingrix (1 of 2) Never done   COVID-19 Vaccine (3 - 2024-25 season) 07/17/2023   Medicare Annual Wellness (AWV)  07/27/2023    Past Medical History:  Diagnosis Date   History of kidney stones    Seizures (HCC)    from childhood   Stroke (HCC)    63 years old.    Past Surgical History:  Procedure  Laterality Date   APPENDECTOMY     63 years old    Family History  Problem Relation Age of Onset   Arthritis Mother    Hyperlipidemia Mother    Hypertension Mother    Diabetes Mother    Stroke Mother    Arthritis Father    Hypertension Father    Arthritis Sister    Hyperlipidemia Sister     Social History   Socioeconomic History   Marital status: Single    Spouse name: Not on file   Number of children: 3   Years of education: Not on file   Highest education level: Not on file  Occupational History   Not on file  Tobacco Use   Smoking status: Never   Smokeless tobacco: Never  Vaping Use   Vaping status: Never Used  Substance and Sexual Activity   Alcohol use: No   Drug use: No   Sexual activity: Yes  Other Topics Concern   Not on file  Social History Narrative   ** Merged History Encounter ** Regular exercise:     NoCaffeine use:    1 soda daily    3 children   Lives with mother   Social Drivers of Health   Financial Resource Strain: Low Risk  (07/26/2022)   Overall Financial Resource Strain (CARDIA)    Difficulty of Paying Living Expenses: Not very hard  Food Insecurity: No Food Insecurity (10/25/2023)  Hunger Vital Sign    Worried About Running Out of Food in the Last Year: Never true    Ran Out of Food in the Last Year: Never true  Transportation Needs: No Transportation Needs (10/25/2023)   PRAPARE - Administrator, Civil Service (Medical): No    Lack of Transportation (Non-Medical): No  Physical Activity: Inactive (07/26/2022)   Exercise Vital Sign    Days of Exercise per Week: 0 days    Minutes of Exercise per Session: 0 min  Stress: No Stress Concern Present (07/26/2022)   [employer] of Occupational Health - Occupational Stress Questionnaire    Feeling of Stress : Not at all  Social Connections: Moderately Integrated (07/26/2022)   Social Connection and Isolation Panel [NHANES]    Frequency of Communication with Friends and  Family: More than three times a week    Frequency of Social Gatherings with Friends and Family: Twice a week    Attends Religious Services: More than 4 times per year    Active Member of [employer] or Organizations: Yes    Attends Engineer, structural: More than 4 times per year    Marital Status: Never married  Intimate Partner Violence: Not At Risk (10/25/2023)   Humiliation, Afraid, Rape, and Kick questionnaire    Fear of Current or Ex-Partner: No    Emotionally Abused: No    Physically Abused: No    Sexually Abused: No    Outpatient Medications Prior to Visit  Medication Sig Dispense Refill   amLODipine  (NORVASC ) 10 MG tablet TAKE 1 TABLET BY MOUTH EVERY DAY 90 tablet 0   aspirin EC 81 MG tablet Take 81 mg by mouth daily.     atorvastatin  (LIPITOR) 10 MG tablet TAKE 1 TABLET BY MOUTH EVERY DAY 90 tablet 0   Calcium  Carbonate-Vitamin D 600-400 MG-UNIT tablet Take 1 tablet by mouth 2 (two) times daily.     No facility-administered medications prior to visit.    Allergies  Allergen Reactions   Penicillins Rash    ROS See HPI    Objective:    Physical Exam Constitutional:      General: She is not in acute distress.    Appearance: Normal appearance. She is well-developed.  HENT:     Head: Normocephalic and atraumatic.     Right Ear: External ear normal.     Left Ear: External ear normal.  Eyes:     General: No scleral icterus. Neck:     Thyroid: No thyromegaly.  Cardiovascular:     Rate and Rhythm: Normal rate and regular rhythm.     Heart sounds: Normal heart sounds. No murmur heard. Pulmonary:     Effort: Pulmonary effort is normal. No respiratory distress.     Breath sounds: Normal breath sounds. No wheezing.  Musculoskeletal:     Cervical back: Neck supple.  Skin:    General: Skin is warm and dry.  Neurological:     Mental Status: She is alert and oriented to person, place, and time.  Psychiatric:        Mood and Affect: Mood normal.        Behavior:  Behavior normal.        Thought Content: Thought content normal.        Judgment: Judgment normal.      BP 127/80 (BP Location: Left Arm, Patient Position: Sitting, Cuff Size: Normal)   Pulse (!) 103   Temp 98 F (36.7 C) (Oral)   Resp  16   Ht 5\' 1"  (1.549 m)   Wt 147 lb (66.7 kg)   LMP 11/15/1957   SpO2 97%   BMI 27.78 kg/m  Wt Readings from Last 3 Encounters:  03/14/24 147 lb (66.7 kg)  02/21/24 149 lb (67.6 kg)  10/25/23 160 lb (72.6 kg)       Assessment & Plan:   Problem List Items Addressed This Visit       Unprioritized   Vasovagal syncope   Hx most consistent with vasovagal syncope. She was standing in the heat when it occurred and there was no obvious seizure activity. We discussed good hydration.  Family is concerned about the possibility of this episode being secondary to a seizure.  Referral has been placed to neurology for further evaluation.      Seizure disorder [facility])   Relevant Orders   Ambulatory referral to Neurology   HTN (hypertension)   BP Readings from Last 3 Encounters:  03/14/24 127/80  02/21/24 126/67  10/25/23 131/75  At goal on amlodipine  10mg .        History of stroke - Primary   Continue aspirin/statin for secondary prevention.      Relevant Orders   Ambulatory referral to Neurology   Other Visit Diagnoses       Hypokalemia       Relevant Orders   Basic Metabolic Panel (BMET) (Completed)       I am having Lakeysha J. Thiam maintain her aspirin EC, Calcium  Carbonate-Vitamin D, amLODipine , and atorvastatin .  No orders of the defined types were placed in this encounter.

## 2024-03-14 NOTE — Patient Instructions (Signed)
 VISIT SUMMARY:  Today, you were seen for a recent episode of loss of consciousness and possible seizure. This episode occurred while you were standing outside in a hot environment and was similar to a previous incident you experienced. We discussed your current medications and overall health status, including your blood pressure and cholesterol management.  YOUR PLAN:  -SYNCOPE: Syncope is a temporary loss of consciousness usually related to insufficient blood flow to the brain. Your recent episode was likely due to heat exposure. If you experience another episode, we will refer you to a neurologist for further evaluation.  -HYPERTENSION: Hypertension is high blood pressure. Your blood pressure is well controlled at 127/80 mmHg with your current medication, amlodipine  10 mg daily. You should continue taking amlodipine  and aspirin 81 mg daily as prescribed.  -STROKE: A stroke occurs when the blood supply to part of your brain is interrupted or reduced. Your cholesterol levels are being managed to prevent a stroke, and you should continue with your current cholesterol management plan.  -HYPOKALEMIA: Hypokalemia is a condition where you have low levels of potassium in your blood. We will recheck your potassium levels, and if they remain low, we may consider potassium supplements.  INSTRUCTIONS:  Please follow up for a recheck of your potassium levels. If you experience another episode of loss of consciousness, please contact us  immediately for a referral to a neurologist.

## 2024-03-15 DIAGNOSIS — R55 Syncope and collapse: Secondary | ICD-10-CM | POA: Insufficient documentation

## 2024-03-15 LAB — BASIC METABOLIC PANEL WITH GFR
BUN: 6 mg/dL (ref 6–23)
CO2: 25 meq/L (ref 19–32)
Calcium: 9.5 mg/dL (ref 8.4–10.5)
Chloride: 100 meq/L (ref 96–112)
Creatinine, Ser: 0.6 mg/dL (ref 0.40–1.20)
GFR: 95.28 mL/min (ref >60.00)
Glucose, Bld: 99 mg/dL (ref 70–99)
Potassium: 3.5 meq/L (ref 3.5–5.1)
Sodium: 134 meq/L — ABNORMAL LOW (ref 135–145)

## 2024-03-15 NOTE — Assessment & Plan Note (Signed)
 Hx most consistent with vasovagal syncope. She was standing in the heat when it occurred and there was no obvious seizure activity. We discussed good hydration.  Family is concerned about the possibility of this episode being secondary to a seizure.  Referral has been placed to neurology for further evaluation.

## 2024-03-27 ENCOUNTER — Other Ambulatory Visit: Payer: Self-pay | Admitting: Family

## 2024-04-14 ENCOUNTER — Encounter (HOSPITAL_BASED_OUTPATIENT_CLINIC_OR_DEPARTMENT_OTHER): Payer: Self-pay | Admitting: Emergency Medicine

## 2024-04-14 ENCOUNTER — Other Ambulatory Visit: Payer: Self-pay

## 2024-04-14 DIAGNOSIS — I10 Essential (primary) hypertension: Secondary | ICD-10-CM | POA: Diagnosis not present

## 2024-04-14 DIAGNOSIS — Z79899 Other long term (current) drug therapy: Secondary | ICD-10-CM | POA: Insufficient documentation

## 2024-04-14 DIAGNOSIS — R600 Localized edema: Secondary | ICD-10-CM | POA: Diagnosis not present

## 2024-04-14 DIAGNOSIS — L03114 Cellulitis of left upper limb: Secondary | ICD-10-CM | POA: Insufficient documentation

## 2024-04-14 DIAGNOSIS — Z7982 Long term (current) use of aspirin: Secondary | ICD-10-CM | POA: Insufficient documentation

## 2024-04-14 DIAGNOSIS — M7989 Other specified soft tissue disorders: Secondary | ICD-10-CM | POA: Diagnosis not present

## 2024-04-14 NOTE — ED Triage Notes (Signed)
 Pt reports swelling that began yesterday in her left hand and in her right leg. Pt denies pain.

## 2024-04-15 ENCOUNTER — Emergency Department (HOSPITAL_BASED_OUTPATIENT_CLINIC_OR_DEPARTMENT_OTHER)
Admission: EM | Admit: 2024-04-15 | Discharge: 2024-04-15 | Disposition: A | Discharge status: Home / Self Care | Attending: Emergency Medicine | Admitting: Emergency Medicine

## 2024-04-15 DIAGNOSIS — R600 Localized edema: Secondary | ICD-10-CM

## 2024-04-15 DIAGNOSIS — L03114 Cellulitis of left upper limb: Secondary | ICD-10-CM

## 2024-04-15 HISTORY — DX: Essential (primary) hypertension: I10

## 2024-04-15 MED ORDER — CEPHALEXIN 500 MG PO CAPS: 500.0000 mg | ORAL_CAPSULE | Freq: Three times a day (TID) | ORAL | 0 refills | Status: DC

## 2024-04-15 MED ORDER — CEPHALEXIN 250 MG PO CAPS
500.0000 mg | ORAL_CAPSULE | Freq: Once | ORAL | Status: AC
  Administered 2024-04-15: 500 mg via ORAL
  Filled 2024-04-15: qty 2

## 2024-04-15 NOTE — Discharge Instructions (Addendum)
 Begin taking Keflex as prescribed.  Return tomorrow at the given time for an ultrasound of your leg to rule out a blood clot.

## 2024-04-15 NOTE — ED Provider Notes (Signed)
 [place] EMERGENCY DEPARTMENT AT [facility] Provider Note   CSN: [id number] Arrival date & time: 04/14/24  2113     History  Chief Complaint  Patient presents with   Leg Swelling   Joint Swelling    Latasha [name] is a 63 y.o. female.  Patient is a 63 year old female with history of hypertension and hyperlipidemia.  Patient presenting today with complaints of swelling in her left wrist and right ankle.  Symptoms began yesterday in the absence of any injury or trauma.  No fevers or chills.  No chest pain or difficulty breathing.       Home Medications Prior to Admission medications   Medication Sig Start Date End Date Taking? Authorizing Provider  amLODipine  (NORVASC ) 10 MG tablet TAKE 1 TABLET BY MOUTH EVERY DAY 03/27/24   O'Sullivan, Melissa, NP  aspirin EC 81 MG tablet Take 81 mg by mouth daily.    [provider]  atorvastatin  (LIPITOR) 10 MG tablet TAKE 1 TABLET BY MOUTH EVERY DAY 03/27/24   O'Sullivan, Melissa, NP  Calcium  Carbonate-Vitamin D 600-400 MG-UNIT tablet Take 1 tablet by mouth 2 (two) times daily.    [provider]      Allergies    Penicillins    Review of Systems   Review of Systems  All other systems reviewed and are negative.   Physical Exam Updated Vital Signs BP (!) 143/91 (BP Location: Right Arm)   Pulse (!) 111   Temp 97.8 F (36.6 C)   Resp 20   LMP 11/15/1957   SpO2 94%  Physical Exam Vitals and nursing note reviewed.  Constitutional:      General: She is not in acute distress.    Appearance: She is well-developed. She is not diaphoretic.  HENT:     Head: Normocephalic and atraumatic.  Cardiovascular:     Rate and Rhythm: Normal rate and regular rhythm.     Heart sounds: No murmur heard.    No friction rub. No gallop.  Pulmonary:     Effort: Pulmonary effort is normal. No respiratory distress.     Breath sounds: Normal breath sounds. No wheezing.  Abdominal:     General: Bowel sounds are  normal. There is no distension.     Palpations: Abdomen is soft.     Tenderness: There is no abdominal tenderness.  Musculoskeletal:        General: Normal range of motion.     Cervical back: Normal range of motion and neck supple.     Comments: The left forearm has redness, warmth, and swelling to the dorsal aspect just proximal to the wrist.  She has full range of motion of the wrist and fingers without discomfort.  The right lower extremity has 1-2+ pitting edema of the ankle.  She has no calf tenderness and Homans' sign is absent.  DP pulses are easily palpable.  Skin:    General: Skin is warm and dry.  Neurological:     General: No focal deficit present.     Mental Status: She is alert and oriented to person, place, and time.     ED Results / Procedures / Treatments   Labs (all labs ordered are listed, but only abnormal results are displayed) Labs Reviewed - No data to display  EKG None  Radiology No results found.  Procedures Procedures    Medications Ordered in ED Medications  cephALEXin (KEFLEX) capsule 500 mg (has no administration in time range)    ED Course/  Medical Decision Making/ A&P  Patient presenting today with complaints of swelling of the left wrist and right ankle that seem unrelated.  There is erythema and warmth to the right forearm just proximal to the wrist that is most consistent with cellulitis.  She will be treated with Keflex for this.  The swelling of the right ankle is concerning for DVT.  Patient will be scheduled for an outpatient ultrasound as ultrasound is gone for the day.  Final Clinical Impression(s) / ED Diagnoses Final diagnoses:  None    Rx / DC Orders ED Discharge Orders     None         Orvilla Blander, MD 04/15/24 [phone]

## 2024-04-16 ENCOUNTER — Ambulatory Visit: Payer: Self-pay

## 2024-04-16 ENCOUNTER — Other Ambulatory Visit: Payer: Self-pay

## 2024-04-16 ENCOUNTER — Telehealth: Payer: Self-pay | Admitting: Family

## 2024-04-16 ENCOUNTER — Ambulatory Visit (HOSPITAL_BASED_OUTPATIENT_CLINIC_OR_DEPARTMENT_OTHER)
Admission: RE | Admit: 2024-04-16 | Discharge: 2024-04-16 | Disposition: A | Discharge status: Home / Self Care | Source: Ambulatory Visit | Attending: Emergency Medicine | Admitting: Emergency Medicine

## 2024-04-16 DIAGNOSIS — M25471 Effusion, right ankle: Secondary | ICD-10-CM | POA: Diagnosis not present

## 2024-04-16 DIAGNOSIS — M7989 Other specified soft tissue disorders: Secondary | ICD-10-CM | POA: Diagnosis not present

## 2024-04-16 NOTE — Telephone Encounter (Signed)
 Pt came in with sister and was scheduled on 6/10.   Pt's sister wants to speak with Henry Loge about some questions she has.Pt saw a provider at another location and pt's sister doesn't trust the rx that was given. I offered to send a message about the rx so that Padonda could look at it but sister refused states she only wants melissa's opinion since they don't trust the one they received. Pt is going downstairs today to get a ultrasound ordered from the provider in [place] due to her arm and leg swelling.

## 2024-04-16 NOTE — Telephone Encounter (Signed)
 Called Renee and was unable to lvm/ Please call pt/sister to get pt scheduled.   Copied from CRM [id number]. Topic: Appointments - Scheduling Inquiry for Clinic >> Apr 16, 2024  8:03 AM Kita Perish H wrote: Reason for CRM: Patients sister Leighton Punches calling to schedule patient a hospital follow up appointment for swelling in left arm and right leg. Was told maybe an infection, Melissa's next appointment isn't until 6/18 and sister states patient needs to come in sooner.  Renee [phone]

## 2024-04-16 NOTE — Telephone Encounter (Signed)
 Patient's sister reports she spoke to provider at emergency department and was notified is ok for her sister to start medication. She will start keflex today and follow up with Melissa O'Sullivan NP next week.  Advised if cellulitis does not get better or gets worse to call for sooner appointment.

## 2024-04-16 NOTE — Telephone Encounter (Signed)
 Information obtained from the sister, Leighton Punches.  Pt was diagnosed with cellulitis, possible infection in L arm. Pt was advised to f/u with PCP. Pt was given keflex yesterday, per pt sister "she has history of seizures and does not want the pt to take the antibiotic". Attempted to scheduled hospital f/u- no appts until 6/18. Routing HP to clinic.   Copied from CRM [id number]. Topic: Appointments - Scheduling Inquiry for Clinic >> Apr 16, 2024  8:03 AM Kita Perish H wrote: Reason for CRM: Patients sister Leighton Punches calling to schedule patient a hospital follow up appointment for swelling in left arm and right leg. Was told maybe an infection, Melissa's next appointment isn't until 6/18 and sister states patient needs to come in sooner.  Leighton Punches [phone] >> Apr 16, 2024  9:58 AM [place] J wrote: Swelling in left arm and right leg. Patient is in need of a hospital follow-up as well.

## 2024-04-16 NOTE — ED Provider Notes (Signed)
  Physical Exam  BP (!) 149/89   Pulse 98   Temp 98 F (36.7 C)   Resp 16   LMP 11/15/1957   SpO2 100%   Physical Exam  Procedures  Procedures  ED Course / MDM    Medical Decision Making Risk Prescription drug management.   Patient presented for report on Doppler.  Informed of negative right lower extremity Doppler.  Reexamined left forearm.  Does have some swelling.  Had not filled antibiotics yet worried about possibility of seizure.  Instructed patient that the antibiotics would actually decrease the likelihood of seizures since being sick and increase the likelihood on its own.       Mozell Arias, MD 04/16/24 1313

## 2024-04-24 ENCOUNTER — Ambulatory Visit (INDEPENDENT_AMBULATORY_CARE_PROVIDER_SITE_OTHER): Admitting: Family

## 2024-04-24 VITALS — BP 122/71 | HR 92 | Temp 98.7°F | Resp 16 | Ht 61.0 in | Wt 144.0 lb

## 2024-04-24 DIAGNOSIS — M7989 Other specified soft tissue disorders: Secondary | ICD-10-CM | POA: Diagnosis not present

## 2024-04-24 DIAGNOSIS — I10 Essential (primary) hypertension: Secondary | ICD-10-CM

## 2024-04-24 DIAGNOSIS — E785 Hyperlipidemia, unspecified: Secondary | ICD-10-CM | POA: Diagnosis not present

## 2024-04-24 DIAGNOSIS — Z1211 Encounter for screening for malignant neoplasm of colon: Secondary | ICD-10-CM

## 2024-04-24 DIAGNOSIS — L03114 Cellulitis of left upper limb: Secondary | ICD-10-CM | POA: Diagnosis not present

## 2024-04-24 NOTE — Patient Instructions (Signed)
 VISIT SUMMARY:  Today, we addressed the swelling in your left wrist and right leg, discussed your hypertension management, and reviewed your cholesterol levels. We also talked about general health maintenance, including your supplements and colon cancer screening.  YOUR PLAN:  LEFT WRIST SWELLING: Swelling has reduced with no tenderness, pain, or redness. It may have been a resolved skin infection. -Monitor for any increased redness, pain, or warmth. -Consider a different antibiotic if symptoms worsen.  LEG SWELLING: Swelling in your right leg and foot is likely due to your medication, amlodipine . An ultrasound showed no clots. -Monitor the swelling in your leg. -Consider reducing amlodipine  and adding a second blood pressure medication if swelling worsens.  HYPERTENSION: Your blood pressure is well-controlled on amlodipine , but we discussed its side effects and possible medication adjustments. -Reduce amlodipine  to half the current dose. -Monitor your blood pressure at home and report the readings on Friday. -Consider adding losartan if your blood pressure increases.  HYPERLIPIDEMIA: Your cholesterol levels are normal, and you are on cholesterol-lowering medication. -Continue your current cholesterol medication. -Recheck your cholesterol levels in three months.  GENERAL HEALTH MAINTENANCE: We discussed your supplements and the Cologuard test for colon cancer screening. -Complete and return the Cologuard test. -We will review the Cologuard test results once available.  FOLLOW-UP: We discussed plans for monitoring your blood pressure and general health maintenance. -Schedule a follow-up appointment in three months. -Send updated blood pressure readings on Friday via MyChart or phone call.

## 2024-04-24 NOTE — Assessment & Plan Note (Signed)
Improved. Monitor.  

## 2024-04-24 NOTE — Progress Notes (Signed)
 Subjective:     Patient ID: Latasha [name], female    DOB: 11-20-1960, 63 y.o.   MRN: [id number]  Chief Complaint  Patient presents with   Follow-up    Here for ed follow up    HPI  Discussed the use of AI scribe software for clinical note transcription with the patient, who gave verbal consent to proceed.  History of Present Illness  Latasha [name] is a 63 year old female who presents today with her daughter and her sister for ER follow up. She was seen on 6/1 with c/o left wrist swelling and right leg swelling.  She was diagnosed with cellulitis of the left forearm and treated with keflex . Notes that the swelling has decreased.  She also had a negative RLE doppler performed.   There is no history of recent injury to the wrist.  Swelling in the right leg and foot is noted, with a negative ultrasound for clots.     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    Past Surgical History:  Procedure Laterality Date   APPENDECTOMY     63 years old    Family History  Problem Relation Age of Onset   Arthritis Mother    Hyperlipidemia Mother    Hypertension Mother    Diabetes Mother    Stroke Mother    Arthritis Father    Hypertension Father    Arthritis Sister    Hyperlipidemia Sister     Social History   Socioeconomic History   Marital status: Single    Spouse name: Not on file   Number of children: 3   Years of education: Not on file   Highest education level: Not on file  Occupational History   Not on file  Tobacco Use   Smoking status: Never   Smokeless tobacco: Never  Vaping Use   Vaping status: Never Used   Substance and Sexual Activity   Alcohol use: No   Drug use: No   Sexual activity: Yes  Other Topics Concern   Not on file  Social History Narrative   ** Merged History Encounter ** Regular exercise:     NoCaffeine use:    1 soda daily    3 children   Lives with mother   Social Drivers of Corporate investment banker Strain: Low Risk  (07/26/2022)   Overall Financial Resource Strain (CARDIA)    Difficulty of Paying Living Expenses: Not very hard  Food Insecurity: No Food Insecurity (10/25/2023)   Hunger Vital Sign    Worried About Running Out of Food in the Last Year: Never true    Ran Out of Food in the Last Year: Never true  Transportation Needs: No Transportation Needs (10/25/2023)   PRAPARE - Administrator, Civil Service (Medical): No    Lack of Transportation (Non-Medical): No  Physical Activity: Inactive (07/26/2022)   Exercise Vital Sign    Days of Exercise per Week: 0 days    Minutes of Exercise per Session: 0 min  Stress: No Stress  Concern Present (07/26/2022)   [employer] of Occupational Health - Occupational Stress Questionnaire    Feeling of Stress : Not at all  Social Connections: Moderately Integrated (07/26/2022)   Social Connection and Isolation Panel [NHANES]    Frequency of Communication with Friends and Family: More than three times a week    Frequency of Social Gatherings with Friends and Family: Twice a week    Attends Religious Services: More than 4 times per year    Active Member of [employer] or Organizations: Yes    Attends Engineer, structural: More than 4 times per year    Marital Status: Never married  Intimate Partner Violence: Not At Risk (10/25/2023)   Humiliation, Afraid, Rape, and Kick questionnaire    Fear of Current or Ex-Partner: No    Emotionally Abused: No    Physically Abused: No    Sexually Abused: No    Outpatient Medications Prior to Visit  Medication Sig Dispense Refill   amLODipine  (NORVASC ) 10 MG tablet  TAKE 1 TABLET BY MOUTH EVERY DAY 90 tablet 0   aspirin EC 81 MG tablet Take 81 mg by mouth daily.     atorvastatin  (LIPITOR) 10 MG tablet TAKE 1 TABLET BY MOUTH EVERY DAY 90 tablet 0   Calcium  Carbonate-Vitamin D 600-400 MG-UNIT tablet Take 1 tablet by mouth 2 (two) times daily.     cephALEXin  (KEFLEX ) 500 MG capsule Take 1 capsule (500 mg total) by mouth 3 (three) times daily. 21 capsule 0   No facility-administered medications prior to visit.    Allergies  Allergen Reactions   Penicillins Rash    ROS See HPI    Objective:     Physical Exam Constitutional:      General: She is not in acute distress.    Appearance: Normal appearance. She is well-developed.  HENT:     Head: Normocephalic and atraumatic.     Right Ear: External ear normal.     Left Ear: External ear normal.  Eyes:     General: No scleral icterus. Neck:     Thyroid: No thyromegaly.  Cardiovascular:     Rate and Rhythm: Normal rate and regular rhythm.     Heart sounds: Normal heart sounds. No murmur heard. Pulmonary:     Effort: Pulmonary effort is normal. No respiratory distress.     Breath sounds: Normal breath sounds. No wheezing.  Musculoskeletal:     Cervical back: Neck supple.     Comments: Mild swelling above left wrist watch band, no warmth/tenderness or redness noted  2+ RLE swelling, 1+ LLE swelling  Skin:    General: Skin is warm and dry.  Neurological:     Mental Status: She is alert and oriented to person, place, and time.  Psychiatric:        Mood and Affect: Mood normal.        Behavior: Behavior normal.        Thought Content: Thought content normal.        Judgment: Judgment normal.      BP 122/71 (BP Location: Right Arm, Patient Position: Sitting, Cuff Size: Small)   Pulse 92   Temp 98.7 F (37.1 C) (Oral)   Resp 16   Ht 5\' 1"  (1.549 m)   Wt 144 lb (65.3 kg)   LMP 11/15/1957   SpO2 99%   BMI 27.21 kg/m  Wt Readings from Last 3 Encounters:  04/24/24 144 lb (65.3 kg)   03/14/24 147 lb (66.7 kg)  02/21/24 149 lb (67.6 kg)       Assessment & Plan:   Problem List Items Addressed This Visit       Unprioritized   Screening for colon cancer   Cologuard test for colon cancer screening. -Complete and return the Cologuard test. -We will review the Cologuard test results once available.      Leg swelling   Likely exacerbated by amlodipine .  Doppler negative.       Hyperlipidemia   Lab Results  Component Value Date   CHOL 100 09/16/2023   HDL 38 (L) 09/16/2023   LDLCALC 46 09/16/2023   TRIG 81 09/16/2023   CHOLHDL 2.6 09/16/2023   Lipids stable, continue lipitor 10 mg.       HTN (hypertension)   Recommended decrease amlodipine  dose and addition of a second agent such as losartan.  Sister is hesitant about adding a second medication and wishes to decrease amlodipine  in half and monitor bp at home first. She will call me Friday with repeat bp reading on decreased amlodipine  dose. Hopefully this will help her edema.       Cellulitis of left upper extremity - Primary   Improved.  Monitor.        I have discontinued Latasha [name]'s cephALEXin . I am also having her maintain her aspirin EC, Calcium  Carbonate-Vitamin D, atorvastatin , and amLODipine .  No orders of the defined types were placed in this encounter.

## 2024-04-24 NOTE — Assessment & Plan Note (Addendum)
 Recommended decrease amlodipine  dose and addition of a second agent such as losartan.  Sister is hesitant about adding a second medication and wishes to decrease amlodipine  in half and monitor bp at home first. She will call me Friday with repeat bp reading on decreased amlodipine  dose. Hopefully this will help her edema.

## 2024-04-24 NOTE — Assessment & Plan Note (Signed)
 Cologuard test for colon cancer screening. -Complete and return the Cologuard test. -We will review the Cologuard test results once available.

## 2024-04-24 NOTE — Assessment & Plan Note (Addendum)
 Likely exacerbated by amlodipine .  Doppler negative.

## 2024-04-24 NOTE — Assessment & Plan Note (Signed)
 Lab Results  Component Value Date   CHOL 100 09/16/2023   HDL 38 (L) 09/16/2023   LDLCALC 46 09/16/2023   TRIG 81 09/16/2023   CHOLHDL 2.6 09/16/2023   Lipids stable, continue lipitor 10 mg.

## 2024-06-23 ENCOUNTER — Other Ambulatory Visit: Payer: Self-pay | Admitting: Family

## 2024-07-09 DIAGNOSIS — R569 Unspecified convulsions: Secondary | ICD-10-CM | POA: Diagnosis not present

## 2024-07-09 DIAGNOSIS — Z1330 Encounter for screening examination for mental health and behavioral disorders, unspecified: Secondary | ICD-10-CM | POA: Diagnosis not present

## 2024-07-11 DIAGNOSIS — R569 Unspecified convulsions: Secondary | ICD-10-CM | POA: Diagnosis not present

## 2024-07-23 DIAGNOSIS — G319 Degenerative disease of nervous system, unspecified: Secondary | ICD-10-CM | POA: Diagnosis not present

## 2024-07-23 DIAGNOSIS — R569 Unspecified convulsions: Secondary | ICD-10-CM | POA: Diagnosis not present

## 2024-07-27 ENCOUNTER — Ambulatory Visit: Admitting: Family

## 2024-08-01 ENCOUNTER — Ambulatory Visit (INDEPENDENT_AMBULATORY_CARE_PROVIDER_SITE_OTHER): Admitting: Family

## 2024-08-01 VITALS — BP 139/83 | HR 103 | Temp 98.9°F | Resp 16 | Ht 61.0 in | Wt 137.0 lb

## 2024-08-01 DIAGNOSIS — Z8673 Personal history of transient ischemic attack (TIA), and cerebral infarction without residual deficits: Secondary | ICD-10-CM

## 2024-08-01 DIAGNOSIS — I10 Essential (primary) hypertension: Secondary | ICD-10-CM

## 2024-08-01 DIAGNOSIS — R778 Other specified abnormalities of plasma proteins: Secondary | ICD-10-CM

## 2024-08-01 NOTE — Patient Instructions (Signed)
 VISIT SUMMARY:  You came in for a follow-up on your blood pressure management. We also discussed concerns about potential seizures and your gastrointestinal discomfort.  YOUR PLAN:  Please call Neurology to discuss your questions regarding starting the seizure medication- Keppra.  HYPERTENSION: Your blood pressure is well controlled at 139/83 mmHg, and you are not experiencing any side effects from your current medication. -Continue taking amlodipine  once daily.

## 2024-08-01 NOTE — Progress Notes (Signed)
 Subjective:     Patient ID: Latasha [name], female    DOB: [birth year], 63 y.o.   MRN: [id number]  Chief Complaint  Patient presents with   Hypertension    Here for follow up    Hypertension    Discussed the use of AI scribe software for clinical note transcription with the patient, who gave verbal consent to proceed.  History of Present Illness  Latasha [name] is a 63 year old female who presents for a follow-up on her blood pressure management. She is accompanied by her aunt.  She is taking amlodipine  once daily without experiencing peripheral edema or other adverse effects.  There is concern about potential seizures due to her history of stroke and recent brain atrophy findings. 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 A neurology note mentioned a seizure in April 2025, but neither [place] nor her aunt recall this event. She had an episode of feeling very hot and nearly fainting at an air show in [place] . She had staring spells in her youth but has not experienced these for years since discontinuing medication. There is no history of tonic-clonic seizures per family report. Neurology prescribed Keppra for patient and they are hesitant to start.      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    Past Surgical History:  Procedure Laterality Date   APPENDECTOMY     63 years old    Family History  Problem Relation Age of Onset   Arthritis Mother    Hyperlipidemia Mother    Hypertension Mother    Diabetes Mother    Stroke Mother     Arthritis Father    Hypertension Father    Arthritis Sister    Hyperlipidemia Sister     Social History   Socioeconomic History   Marital status: Single    Spouse name: Not on file   Number of children: 3   Years of education: Not on file   Highest education level: Not on file  Occupational History   Not on file  Tobacco Use   Smoking status: Never   Smokeless tobacco: Never  Vaping Use   Vaping status: Never Used  Substance and Sexual Activity   Alcohol use: No   Drug use: No   Sexual activity: Yes  Other Topics Concern   Not on file  Social History Narrative   ** Merged History Encounter ** Regular exercise:     NoCaffeine use:    1 soda daily    3 children   Lives with mother   Social Drivers of Health   Financial Resource Strain: Low Risk  (08/01/2024)   Overall Financial Resource Strain (CARDIA)    Difficulty of Paying Living Expenses: Not very hard  Food Insecurity: No Food Insecurity (08/01/2024)   Hunger Vital Sign    Worried About Running Out of Food in the Last Year: Never true    Ran Out of Food in the  Last Year: Never true  Transportation Needs: No Transportation Needs (08/01/2024)   PRAPARE - Administrator, Civil Service (Medical): No    Lack of Transportation (Non-Medical): No  Physical Activity: Insufficiently Active (08/01/2024)   Exercise Vital Sign    Days of Exercise per Week: 3 days    Minutes of Exercise per Session: 20 min  Stress: No Stress Concern Present (08/01/2024)   [employer] of Occupational Health - Occupational Stress Questionnaire    Feeling of Stress: Not at all  Social Connections: Moderately Isolated (08/01/2024)   Social Connection and Isolation Panel    Frequency of Communication with Friends and Family: More than three times a week    Frequency of Social Gatherings with Friends and Family: More than three times a week    Attends Religious Services: More than 4 times per year    Active Member of [employer] or  Organizations: No    Attends Engineer, structural: Not on file    Marital Status: Never married  Intimate Partner Violence: Not At Risk (10/25/2023)   Humiliation, Afraid, Rape, and Kick questionnaire    Fear of Current or Ex-Partner: No    Emotionally Abused: No    Physically Abused: No    Sexually Abused: No    Outpatient Medications Prior to Visit  Medication Sig Dispense Refill   amLODipine  (NORVASC ) 10 MG tablet TAKE 1 TABLET BY MOUTH EVERY DAY 90 tablet 0   aspirin EC 81 MG tablet Take 81 mg by mouth daily.     atorvastatin  (LIPITOR) 10 MG tablet TAKE 1 TABLET BY MOUTH EVERY DAY 90 tablet 0   Calcium  Carbonate-Vitamin D 600-400 MG-UNIT tablet Take 1 tablet by mouth 2 (two) times daily.     No facility-administered medications prior to visit.    Allergies  Allergen Reactions   Penicillins Rash    ROS    See HPI  Objective:    Physical Exam Constitutional:      General: She is not in acute distress.    Appearance: Normal appearance. She is well-developed.  HENT:     Head: Normocephalic and atraumatic.     Right Ear: External ear normal.     Left Ear: External ear normal.  Eyes:     General: No scleral icterus. Neck:     Thyroid: No thyromegaly.  Cardiovascular:     Rate and Rhythm: Normal rate and regular rhythm.     Heart sounds: Normal heart sounds. No murmur heard. Pulmonary:     Effort: Pulmonary effort is normal. No respiratory distress.     Breath sounds: Normal breath sounds. No wheezing.  Musculoskeletal:     Cervical back: Neck supple.  Skin:    General: Skin is warm and dry.  Neurological:     Mental Status: She is alert and oriented to person, place, and time.  Psychiatric:        Mood and Affect: Mood normal.        Behavior: Behavior normal.        Thought Content: Thought content normal.        Judgment: Judgment normal.      BP 139/83 (BP Location: Right Arm, Patient Position: Sitting, Cuff Size: Small)   Pulse (!) 103    Temp 98.9 F (37.2 C) (Oral)   Resp 16   Ht 5' 1 (1.549 m)   Wt 137 lb (62.1 kg)   LMP 11/15/1957   SpO2 98%   BMI 25.89  kg/m  Wt Readings from Last 3 Encounters:  08/01/24 137 lb (62.1 kg)  04/24/24 144 lb (65.3 kg)  03/14/24 147 lb (66.7 kg)       Assessment & Plan:   Problem List Items Addressed This Visit       Unprioritized   HTN (hypertension) - Primary   BP Readings from Last 3 Encounters:  08/01/24 139/83  04/24/24 122/71  04/15/24 (!) 149/89   BP stable. Continue amlodipine  5mg  once daily.       History of stroke   I advised family to reach out to Neurology to discuss their concerns about starting Keppra and to discuss risk/benefit.       Abnormal SPEP   She continues to follow with Hematology for elevated gamma globulin and has upcoming lab work scheduled.       Patient declines flu shot and pneumonia shot.  I am having Earnesteen J. Lonon maintain her aspirin EC, Calcium  Carbonate-Vitamin D, atorvastatin , and amLODipine .  No orders of the defined types were placed in this encounter.

## 2024-08-01 NOTE — Assessment & Plan Note (Signed)
 BP Readings from Last 3 Encounters:  08/01/24 139/83  04/24/24 122/71  04/15/24 (!) 149/89   BP stable. Continue amlodipine  5mg  once daily.

## 2024-08-01 NOTE — Assessment & Plan Note (Signed)
 She continues to follow with Hematology for elevated gamma globulin and has upcoming lab work scheduled.

## 2024-08-01 NOTE — Assessment & Plan Note (Signed)
 I advised family to reach out to Neurology to discuss their concerns about starting Keppra and to discuss risk/benefit.

## 2024-08-16 ENCOUNTER — Inpatient Hospital Stay: Attending: Hematology & Oncology

## 2024-08-16 ENCOUNTER — Inpatient Hospital Stay: Admitting: Medical Oncology

## 2024-08-16 DIAGNOSIS — R778 Other specified abnormalities of plasma proteins: Secondary | ICD-10-CM | POA: Insufficient documentation

## 2024-08-16 DIAGNOSIS — Z87442 Personal history of urinary calculi: Secondary | ICD-10-CM | POA: Insufficient documentation

## 2024-08-16 DIAGNOSIS — Z7982 Long term (current) use of aspirin: Secondary | ICD-10-CM | POA: Insufficient documentation

## 2024-08-16 DIAGNOSIS — D649 Anemia, unspecified: Secondary | ICD-10-CM | POA: Insufficient documentation

## 2024-08-16 DIAGNOSIS — Z880 Allergy status to penicillin: Secondary | ICD-10-CM | POA: Insufficient documentation

## 2024-08-16 DIAGNOSIS — Z8673 Personal history of transient ischemic attack (TIA), and cerebral infarction without residual deficits: Secondary | ICD-10-CM | POA: Insufficient documentation

## 2024-08-16 DIAGNOSIS — I10 Essential (primary) hypertension: Secondary | ICD-10-CM | POA: Insufficient documentation

## 2024-08-16 DIAGNOSIS — Z79899 Other long term (current) drug therapy: Secondary | ICD-10-CM | POA: Insufficient documentation

## 2024-08-22 DIAGNOSIS — R569 Unspecified convulsions: Secondary | ICD-10-CM | POA: Diagnosis not present

## 2024-08-28 ENCOUNTER — Ambulatory Visit: Payer: Self-pay | Admitting: Medical Oncology

## 2024-08-28 ENCOUNTER — Inpatient Hospital Stay (HOSPITAL_BASED_OUTPATIENT_CLINIC_OR_DEPARTMENT_OTHER): Admitting: Medical Oncology

## 2024-08-28 ENCOUNTER — Encounter: Payer: Self-pay | Admitting: Medical Oncology

## 2024-08-28 ENCOUNTER — Inpatient Hospital Stay

## 2024-08-28 VITALS — BP 132/72 | HR 95 | Temp 98.0°F | Resp 18 | Ht 61.0 in | Wt 142.0 lb

## 2024-08-28 DIAGNOSIS — Z79899 Other long term (current) drug therapy: Secondary | ICD-10-CM | POA: Diagnosis not present

## 2024-08-28 DIAGNOSIS — R778 Other specified abnormalities of plasma proteins: Secondary | ICD-10-CM

## 2024-08-28 DIAGNOSIS — Z7982 Long term (current) use of aspirin: Secondary | ICD-10-CM | POA: Diagnosis not present

## 2024-08-28 DIAGNOSIS — Z87442 Personal history of urinary calculi: Secondary | ICD-10-CM | POA: Diagnosis not present

## 2024-08-28 DIAGNOSIS — D649 Anemia, unspecified: Secondary | ICD-10-CM

## 2024-08-28 DIAGNOSIS — I10 Essential (primary) hypertension: Secondary | ICD-10-CM | POA: Diagnosis not present

## 2024-08-28 DIAGNOSIS — Z880 Allergy status to penicillin: Secondary | ICD-10-CM | POA: Diagnosis not present

## 2024-08-28 DIAGNOSIS — Z8673 Personal history of transient ischemic attack (TIA), and cerebral infarction without residual deficits: Secondary | ICD-10-CM | POA: Diagnosis not present

## 2024-08-28 LAB — CBC WITH DIFFERENTIAL (CANCER CENTER ONLY)
Abs Immature Granulocytes: 0.04 K/uL (ref 0.00–0.07)
Basophils Absolute: 0 K/uL (ref 0.0–0.1)
Basophils Relative: 0 %
Eosinophils Absolute: 0 K/uL (ref 0.0–0.5)
Eosinophils Relative: 0 %
HCT: 34.3 % — ABNORMAL LOW (ref 36.0–46.0)
Hemoglobin: 11.1 g/dL — ABNORMAL LOW (ref 12.0–15.0)
Immature Granulocytes: 1 %
Lymphocytes Relative: 18 %
Lymphs Abs: 0.9 K/uL (ref 0.7–4.0)
MCH: 27.4 pg (ref 26.0–34.0)
MCHC: 32.4 g/dL (ref 30.0–36.0)
MCV: 84.7 fL (ref 80.0–100.0)
Monocytes Absolute: 0.4 K/uL (ref 0.1–1.0)
Monocytes Relative: 9 %
Neutro Abs: 3.5 K/uL (ref 1.7–7.7)
Neutrophils Relative %: 72 %
Platelet Count: 383 K/uL (ref 150–400)
RBC: 4.05 MIL/uL (ref 3.87–5.11)
RDW: 14 % (ref 11.5–15.5)
WBC Count: 4.8 K/uL (ref 4.0–10.5)
nRBC: 0 % (ref 0.0–0.2)

## 2024-08-28 LAB — CMP (CANCER CENTER ONLY)
ALT: 52 U/L — ABNORMAL HIGH (ref 0–44)
AST: 55 U/L — ABNORMAL HIGH (ref 15–41)
Albumin: 3.8 g/dL (ref 3.5–5.0)
Alkaline Phosphatase: 111 U/L (ref 38–126)
Anion gap: 10 (ref 5–15)
BUN: 6 mg/dL — ABNORMAL LOW (ref 8–23)
CO2: 25 mmol/L (ref 22–32)
Calcium: 9.2 mg/dL (ref 8.9–10.3)
Chloride: 102 mmol/L (ref 98–111)
Creatinine: 0.56 mg/dL (ref 0.44–1.00)
GFR, Estimated: >60 mL/min (ref >60)
Glucose, Bld: 105 mg/dL — ABNORMAL HIGH (ref 70–99)
Potassium: 3.7 mmol/L (ref 3.5–5.1)
Sodium: 137 mmol/L (ref 135–145)
Total Bilirubin: 0.5 mg/dL (ref 0.0–1.2)
Total Protein: 8.2 g/dL — ABNORMAL HIGH (ref 6.5–8.1)

## 2024-08-28 LAB — VITAMIN B12: Vitamin B-12: 802 pg/mL (ref 180–914)

## 2024-08-28 LAB — FERRITIN: Ferritin: 293 ng/mL (ref 11–307)

## 2024-08-28 LAB — IRON AND IRON BINDING CAPACITY (CC-WL,HP ONLY)
Iron: 42 ug/dL (ref 28–170)
Saturation Ratios: 15 % (ref 10.4–31.8)
TIBC: 279 ug/dL (ref 250–450)
UIBC: 237 ug/dL

## 2024-08-28 LAB — RETIC PANEL
Immature Retic Fract: 4.5 % (ref 2.3–15.9)
RBC.: 4.04 MIL/uL (ref 3.87–5.11)
Retic Count, Absolute: 47.7 K/uL (ref 19.0–186.0)
Retic Ct Pct: 1.2 % (ref 0.4–3.1)
Reticulocyte Hemoglobin: 31.1 pg (ref >27.9)

## 2024-08-28 LAB — LACTATE DEHYDROGENASE: LDH: 223 U/L — ABNORMAL HIGH (ref 98–192)

## 2024-08-28 LAB — FOLATE: Folate: 9.2 ng/mL (ref >5.9)

## 2024-08-28 NOTE — Progress Notes (Signed)
 Hematology and Oncology Follow Up Visit  Latasha [name] [id number] 05-17-1961 63 y.o. 08/28/2024  Past Medical History:  Diagnosis Date   History of kidney stones    Hypertension    Seizures (HCC)    from childhood   Stroke (HCC)    63 years old.    Principle Diagnosis:  Abnormal SPEP- Elevated gamma globulin   Current Therapy:   Observation      Interim History:  Latasha [name] is back for follow-up for elevated gamma globulin:  She is here with her daughter Latasha [name]   Today they state that she has been well. They have no concerns or complaints  There has been no bleeding to her knowledge: denies epistaxis, gingivitis, hemoptysis, hematemesis, hematuria, melena, excessive bruising, blood donation.   No infections, SOB, night sweats, new bone pains.     Weight and appetite are stable Wt Readings from Last 3 Encounters:  08/28/24 142 lb (64.4 kg)  08/01/24 137 lb (62.1 kg)  04/24/24 144 lb (65.3 kg)     Medications:   Current Outpatient Medications:    amLODipine  (NORVASC ) 10 MG tablet, TAKE 1 TABLET BY MOUTH EVERY DAY, Disp: 90 tablet, Rfl: 0   aspirin EC 81 MG tablet, Take 81 mg by mouth daily., Disp: , Rfl:    atorvastatin  (LIPITOR) 10 MG tablet, TAKE 1 TABLET BY MOUTH EVERY DAY, Disp: 90 tablet, Rfl: 0   Calcium  Carbonate-Vitamin D 600-400 MG-UNIT tablet, Take 1 tablet by mouth 2 (two) times daily., Disp: , Rfl:    levETIRAcetam (KEPPRA) 500 MG tablet, Take 500 mg by mouth daily., Disp: , Rfl:   Allergies:  Allergies  Allergen Reactions   Penicillins Rash    Past Medical History, Surgical history, Social history, and Family History were reviewed and updated.  Review of Systems: Review of Systems  Constitutional: Negative.   HENT:  Negative.    Eyes: Negative.   Respiratory: Negative.    Cardiovascular: Negative.   Gastrointestinal: Negative.   Endocrine: Negative.   Genitourinary: Negative.    Musculoskeletal: Negative.   Skin: Negative.    Neurological: Negative.   Hematological: Negative.   Psychiatric/Behavioral: Negative.       Physical Exam:  height is 5' 1 (1.549 m) and weight is 142 lb (64.4 kg). Her oral temperature is 98 F (36.7 C). Her blood pressure is 132/72 and her pulse is 95. Her respiration is 18 and oxygen saturation is 99%.   Physical Exam General: NAD Cardiovascular: regular rate and rhythm Pulmonary: clear ant fields Abdomen: soft, nontender, + bowel sounds GU: no suprapubic tenderness Extremities: no edema, no joint deformities Skin: no rashes Neurological: Weakness but otherwise nonfocal   Lab Results  Component Value Date   WBC 4.8 08/28/2024   HGB 11.1 (L) 08/28/2024   HCT 34.3 (L) 08/28/2024   MCV 84.7 08/28/2024   PLT 383 08/28/2024     Chemistry      Component Value Date/Time   NA 134 (L) 03/14/2024 1632   K 3.5 03/14/2024 1632   CL 100 03/14/2024 1632   CO2 25 03/14/2024 1632   BUN 6 03/14/2024 1632   CREATININE 0.60 03/14/2024 1632   CREATININE 0.52 02/21/2024 0927   CREATININE 0.66 09/16/2023 1606      Component Value Date/Time   CALCIUM  9.5 03/14/2024 1632   ALKPHOS 77 02/21/2024 0927   AST 20 02/21/2024 0927   ALT 10 02/21/2024 0927   BILITOT 0.7 02/21/2024 0927     Encounter Diagnoses  Name Primary?   Abnormal SPEP Yes   Anemia, unspecified type     Assessment and Plan- Patient is a 63 y.o. female who was referred to our office for an elevated gamma globulin. Initial MM studies showed no M spike. Additional labs including nutritional studies pending today.    Today her CBC shows a Hgb of 11.1 which is stable.  Normal Retic Nutritional labs, Myeloma studies pending Continue balanced diet   Disposition: RTC 6 months APP, labs (CBC w/, CMP, iron, ferritin, B12, folate, retic, MM panel, light chains, LDH)   Lauraine Dais PA-C 10/14/20253:29 PM

## 2024-08-29 LAB — KAPPA/LAMBDA LIGHT CHAINS
Kappa free light chain: 40.1 mg/L — ABNORMAL HIGH (ref 3.3–19.4)
Kappa, lambda light chain ratio: 0.78 (ref 0.26–1.65)
Lambda free light chains: 51.2 mg/L — ABNORMAL HIGH (ref 5.7–26.3)

## 2024-08-30 NOTE — Telephone Encounter (Signed)
-----   Message from Lauraine CHRISTELLA Dais sent at 08/30/2024  2:59 PM EDT ----- Please call sister Brynna Dobos with results per patient   General stability of labs with the exception of her liver enzymes which are up slightly. I would recommend that she avoid any tylenol and reduce any alcohol intake and have her PCP recheck these in 1  month.  ----- Message ----- From: Interface, [facility] [place] Sent: 08/28/2024   3:07 PM EDT To: Lauraine CHRISTELLA Dais, PA-C

## 2024-08-30 NOTE — Telephone Encounter (Signed)
 Sister Charlies advised of results per pt request and stated understanding.

## 2024-09-03 LAB — MULTIPLE MYELOMA PANEL, SERUM
Albumin SerPl Elph-Mcnc: 3.2 g/dL (ref 2.9–4.4)
Albumin/Glob SerPl: 0.7 (ref 0.7–1.7)
Alpha 1: 0.3 g/dL (ref 0.0–0.4)
Alpha2 Glob SerPl Elph-Mcnc: 0.8 g/dL (ref 0.4–1.0)
B-Globulin SerPl Elph-Mcnc: 0.9 g/dL (ref 0.7–1.3)
Gamma Glob SerPl Elph-Mcnc: 2.6 g/dL — ABNORMAL HIGH (ref 0.4–1.8)
Globulin, Total: 4.6 g/dL — ABNORMAL HIGH (ref 2.2–3.9)
IgA: 277 mg/dL (ref 87–352)
IgG (Immunoglobin G), Serum: 2870 mg/dL — ABNORMAL HIGH (ref 586–1602)
IgM (Immunoglobulin M), Srm: 113 mg/dL (ref 26–217)
Total Protein ELP: 7.8 g/dL (ref 6.0–8.5)

## 2024-09-14 ENCOUNTER — Ambulatory Visit: Admitting: Family

## 2024-09-23 ENCOUNTER — Other Ambulatory Visit: Payer: Self-pay | Admitting: Family

## 2024-09-24 ENCOUNTER — Telehealth: Payer: Self-pay | Admitting: Family

## 2024-09-24 NOTE — Telephone Encounter (Signed)
 Copied from CRM #[id number]. Topic: Medicare AWV >> Sep 24, 2024 11:19 AM Nathanel DEL wrote: Called LVM 09/24/2024 to schedule AWV. Please schedule office or virtual visits  Nathanel Paschal; Care Guide Ambulatory Clinical Support [place] [facility] Direct Dial: [phone]

## 2024-10-02 DIAGNOSIS — R569 Unspecified convulsions: Secondary | ICD-10-CM | POA: Diagnosis not present

## 2025-01-30 ENCOUNTER — Ambulatory Visit: Admitting: Family

## 2025-02-26 ENCOUNTER — Inpatient Hospital Stay: Attending: Hematology & Oncology

## 2025-02-26 ENCOUNTER — Inpatient Hospital Stay: Admitting: Medical Oncology
